# Patient Record
Sex: Male | Born: 1965 | Race: White | Hispanic: No | Marital: Married | Smoking: Former smoker
Health system: Southern US, Community
[De-identification: ages and names within clinical notes are randomized; demographics above are authoritative.]

## PROBLEM LIST (undated history)

## (undated) DIAGNOSIS — K219 Gastro-esophageal reflux disease without esophagitis: Secondary | ICD-10-CM

## (undated) DIAGNOSIS — M1990 Unspecified osteoarthritis, unspecified site: Secondary | ICD-10-CM

## (undated) DIAGNOSIS — F419 Anxiety disorder, unspecified: Secondary | ICD-10-CM

## (undated) DIAGNOSIS — I7300 Raynaud's syndrome without gangrene: Secondary | ICD-10-CM

## (undated) DIAGNOSIS — T7840XA Allergy, unspecified, initial encounter: Secondary | ICD-10-CM

## (undated) HISTORY — PX: COLONOSCOPY: SHX174

## (undated) HISTORY — DX: Raynaud's syndrome without gangrene: I73.00

## (undated) HISTORY — PX: INGUINAL HERNIA REPAIR: SUR1180

## (undated) HISTORY — DX: Gastro-esophageal reflux disease without esophagitis: K21.9

## (undated) HISTORY — DX: Allergy, unspecified, initial encounter: T78.40XA

## (undated) HISTORY — DX: Unspecified osteoarthritis, unspecified site: M19.90

---

## 2013-03-11 ENCOUNTER — Emergency Department (HOSPITAL_COMMUNITY)
Admission: EM | Admit: 2013-03-11 | Discharge: 2013-03-11 | Disposition: A | Discharge status: Home / Self Care | Payer: PRIVATE HEALTH INSURANCE | Attending: Emergency Medicine | Admitting: Emergency Medicine

## 2013-03-11 ENCOUNTER — Encounter (HOSPITAL_COMMUNITY): Payer: Self-pay | Admitting: Emergency Medicine

## 2013-03-11 ENCOUNTER — Emergency Department (HOSPITAL_COMMUNITY): Payer: PRIVATE HEALTH INSURANCE

## 2013-03-11 ENCOUNTER — Encounter: Payer: Self-pay | Admitting: *Deleted

## 2013-03-11 DIAGNOSIS — Z8659 Personal history of other mental and behavioral disorders: Secondary | ICD-10-CM | POA: Insufficient documentation

## 2013-03-11 DIAGNOSIS — Z79899 Other long term (current) drug therapy: Secondary | ICD-10-CM | POA: Insufficient documentation

## 2013-03-11 DIAGNOSIS — R42 Dizziness and giddiness: Secondary | ICD-10-CM | POA: Insufficient documentation

## 2013-03-11 DIAGNOSIS — Z791 Long term (current) use of non-steroidal anti-inflammatories (NSAID): Secondary | ICD-10-CM | POA: Insufficient documentation

## 2013-03-11 DIAGNOSIS — R0789 Other chest pain: Secondary | ICD-10-CM | POA: Insufficient documentation

## 2013-03-11 DIAGNOSIS — R61 Generalized hyperhidrosis: Secondary | ICD-10-CM | POA: Insufficient documentation

## 2013-03-11 HISTORY — DX: Anxiety disorder, unspecified: F41.9

## 2013-03-11 LAB — COMPREHENSIVE METABOLIC PANEL
Albumin: 4.3 g/dL (ref 3.5–5.2)
BUN: 15 mg/dL (ref 6–23)
Chloride: 103 mEq/L (ref 96–112)
Creatinine, Ser: 0.92 mg/dL (ref 0.50–1.35)
Total Bilirubin: 0.3 mg/dL (ref 0.3–1.2)
Total Protein: 7.3 g/dL (ref 6.0–8.3)

## 2013-03-11 LAB — CBC
HCT: 44.8 % (ref 39.0–52.0)
MCH: 30.5 pg (ref 26.0–34.0)
MCHC: 35 g/dL (ref 30.0–36.0)
MCV: 87.2 fL (ref 78.0–100.0)
RDW: 13.2 % (ref 11.5–15.5)

## 2013-03-11 LAB — POCT I-STAT, CHEM 8
Creatinine, Ser: 1.1 mg/dL (ref 0.50–1.35)
Hemoglobin: 15.6 g/dL (ref 13.0–17.0)
Potassium: 3.8 mEq/L (ref 3.5–5.1)
Sodium: 142 mEq/L (ref 135–145)

## 2013-03-11 LAB — POCT I-STAT TROPONIN I: Troponin i, poc: 0.04 ng/mL (ref 0.00–0.08)

## 2013-03-11 NOTE — ED Provider Notes (Signed)
History     CSN: [id number]  Arrival date & time 03/11/13  1331   First MD Initiated Contact with Patient 03/11/13 1507      No chief complaint on file.   (Consider location/radiation/quality/duration/timing/severity/associated sxs/prior treatment) HPI Comments: Patient is a 47 year old male with no significant past medical history who presents for 3 episodes of chest pain beginning at the end of last week. Patient states his chest pain is substernal and starts as a tightening/squeezing sensation becoming sharp in quality over time. Patient's first episode at the end of last week lasted approximately one to 2 minutes. He then admits to a second episode yesterday morning before church lasting approximately 15 minutes; his third episode occurred at 8:00 this morning and lasted 30 minutes before completely resolving while the patient was taking out the trash. Patient denies any radiation of his chest pain as well as any aggravating or alleviating factors; states he took aspirin yesterday without only mild relief. He admits to associated symptoms of diaphoresis, lightheadedness/dizziness, stating "it feels like my head is underwater", as well as a heavy sensation in his bilateral upper extremities.patient denies headache, fevers, vision changes, shortness of breath, nausea or vomiting, abdominal pain, and numbness or tingling in his upper and lower extremities. Patient admits to a family history of coronary artery disease in his father; states his father "had a few stents placed in his mid 10s". Patient admits to smoking history but states he quit 10 years ago. The history is provided by the patient. No language interpreter was used.    Past Medical History  Diagnosis Date  . Anxiety     No past surgical history on file.  No family history on file.  History  Substance Use Topics  . Smoking status: Never Smoker   . Smokeless tobacco: Not on file  . Alcohol Use: Yes     Review of Systems   Constitutional: Positive for diaphoresis. Negative for fever and chills.  HENT: Negative for hearing loss and tinnitus.   Eyes: Negative for visual disturbance.  Respiratory: Negative for shortness of breath.   Cardiovascular: Positive for chest pain. Negative for palpitations and leg swelling.  Gastrointestinal: Negative for nausea, vomiting and abdominal pain.  Skin: Negative for color change.  Neurological: Positive for dizziness and light-headedness. Negative for syncope, speech difficulty and headaches.  All other systems reviewed and are negative.    Allergies  Lodine  Home Medications   Current Outpatient Rx  Name  Route  Sig  Dispense  Refill  . busPIRone (BUSPAR) 15 MG tablet   Oral   Take 15 mg by mouth 2 (two) times daily.         Marland Kitchen FIBER PO   Oral   Take 1 capsule by mouth 2 (two) times daily.         . naproxen (NAPROSYN) 500 MG tablet   Oral   Take 500 mg by mouth 2 (two) times daily with a meal.           BP 122/88  Pulse 70  Temp(Src) 98.1 F (36.7 C)  Resp 18  SpO2 99%  Physical Exam  Nursing note and vitals reviewed. Constitutional: He is oriented to person, place, and time. He appears well-developed and well-nourished. No distress.  Patient is calm, well and nontoxic appearing, and in no acute distress. He denies experiencing any chest pain at this time.  HENT:  Head: Normocephalic and atraumatic.  Mouth/Throat: Oropharynx is clear and moist. No oropharyngeal exudate.  Eyes: Conjunctivae and EOM are normal. Pupils are equal, round, and reactive to light. No scleral icterus.  Neck: Normal range of motion. Neck supple.  Cardiovascular: Normal rate, regular rhythm, normal heart sounds and intact distal pulses.   Distal radial, dorsalis pedis, and posterior tibial pulses 2+ bilaterally. Capillary refill less than 2 seconds in bilateral upper extremities  Pulmonary/Chest: Effort normal and breath sounds normal. No respiratory distress. He has  no wheezes. He has no rales. He exhibits no tenderness.  Abdominal: Soft. Bowel sounds are normal. He exhibits no distension. There is no tenderness. There is no rebound and no guarding.  Musculoskeletal: Normal range of motion. He exhibits no edema.  Lymphadenopathy:    He has no cervical adenopathy.  Neurological: He is alert and oriented to person, place, and time. No cranial nerve deficit.  Skin: Skin is warm and dry. He is not diaphoretic.  Psychiatric: He has a normal mood and affect. His behavior is normal.    ED Course  Procedures (including critical care time)  Labs Reviewed  POCT I-STAT, CHEM 8 - Abnormal; Notable for the following:    Calcium, Ion 1.28 (*)    All other components within normal limits  CBC  COMPREHENSIVE METABOLIC PANEL  POCT I-STAT TROPONIN I  POCT I-STAT TROPONIN I   Dg Chest 2 View  03/11/2013  *RADIOLOGY REPORT*  Clinical Data: Chest pain  CHEST - 2 VIEW  Comparison: None.  Findings: Several round and oval opacities project over the lung zones compatible with buttons.  Allowing for this, there are no definite pulmonary nodules.  No pneumothorax.  No pleural effusion. Normal heart size.  IMPRESSION: No active cardiopulmonary disease.   Original Report Authenticated By: Jolaine Click, M.D.     Date: 03/11/2013  Rate: 80  Rhythm: normal sinus rhythm  QRS Axis: normal  Intervals: normal  ST/T Wave abnormalities: normal  Conduction Disutrbances:right bundle branch block  Narrative Interpretation: NSR with incomplete RBBB; no STEMI  Old EKG Reviewed: none available I have personally reviewed and interpreted this EKG   1. Chest pain, midsternal      MDM  Patient presents for 3 episodes of chest pain increasing in duration. Patient admits to associated symptoms of diaphoresis, lightheadedness/dizziness, and a heavy sensation in his bilateral arms. Patient's EKG today significant for incomplete right bundle with no evidence of STEMI. His chest x-ray was  negative for signs of acute cardiopulmonary disease and troponin not elevated at 0.04. Second troponin remained 0.04 after 3 hours. He has been symptom free since 8:30AM this morning. At this time patient is calm, well and nontoxic appearing, and in no acute distress with stable vital signs. Patient has a TIMI score of 2, putting him at very low risk of ACS. Believe patient is stable for d/c with cardiology follow up in 24-48 hours for further work up and evaluation of the patient's chest pain as well as PCP follow up. Patient states comfort and understanding with this d/c plan without any unaddressed concerns. This ED work up and management plan was discussed with Dr. Rubin Payor prior to patient d/c; Dr. Rubin Payor in agreement.     Antony Madura, PA-C 03/12/13 1158

## 2013-03-11 NOTE — Progress Notes (Signed)
Patient ID: Danny Snyder, male   DOB: [birth year], 47 y.o.   MRN: [id number] PATIENT CALLED WITH CP, SOB, AND ARM WEAKNESS BILATERALLY. HAS HAD 3 EPISODES SINCE LAST WEEK, CURRENTLY HAVING SYMPTOMS NOW. WILL GO TO ER BY CAR. REFUSED EMS. MOTHER WITH TAKE HIM IMMEDIATELY TO ER. SPOKE WITH EMPLOYEE JIMMY THAT WAS WITH [name] AND HE UNDERSTOOD TO GET HIM TO ER NOW.

## 2013-03-11 NOTE — ED Notes (Signed)
Chest pain started last week came and went yesterday and today had  2 episode and they both passed  Feels funny and swimmy hweaded he states this am pain last 30 mins  sqeezng then goes to sharp

## 2013-03-11 NOTE — ED Notes (Signed)
Discharge instructions reviewed. Pt verbalized understanding.  

## 2013-03-11 NOTE — ED Notes (Signed)
Pt states he began having some intermittent cp that started last Sunday. Pt states he had 3 episodes.  The pain has progressively gotten worse and the last pain lasted for about . Pt denies any cardiac hx. Pt states he is in no pain at this present moment. Pt describes pain as a progressive tightening in the chest and a sharp pain with no radiation, sob, n/v or diaphoresis. Pt does state that he gets lightheaded and dizzy and weakness to upper extremities.

## 2013-03-12 NOTE — ED Provider Notes (Signed)
Medical screening examination/treatment/procedure(s) were conducted as a shared visit with non-physician practitioner(s) and myself.  I personally evaluated the patient during the encounter. Chest pain. Negative troponin x2. EKG reassuring. Will be discharged home follow up with cardiology for  [facility] R. Rubin Payor, MD 03/12/13 [phone]

## 2013-03-26 ENCOUNTER — Other Ambulatory Visit: Payer: Self-pay | Admitting: *Deleted

## 2013-03-26 MED ORDER — NAPROXEN 500 MG PO TABS: 500.0000 mg | ORAL_TABLET | Freq: Two times a day (BID) | ORAL | Status: DC

## 2013-03-26 MED ORDER — BUSPIRONE HCL 15 MG PO TABS: 15.0000 mg | ORAL_TABLET | Freq: Three times a day (TID) | ORAL | Status: DC | PRN

## 2013-03-26 NOTE — Telephone Encounter (Signed)
Patient last seen on 09-13-12 for sick visit. Last chronic med visit was 03-01-12. Please advise. Thank you

## 2013-04-16 ENCOUNTER — Encounter: Payer: PRIVATE HEALTH INSURANCE | Admitting: Cardiology

## 2013-05-18 ENCOUNTER — Other Ambulatory Visit: Payer: Self-pay | Admitting: Nurse Practitioner

## 2013-05-20 NOTE — Telephone Encounter (Signed)
No labs or seen since 03/13

## 2013-06-24 ENCOUNTER — Other Ambulatory Visit: Payer: Self-pay | Admitting: Nurse Practitioner

## 2013-06-26 NOTE — Telephone Encounter (Signed)
LAST OV 9/13. LAST RF 05/20/13. SEE ALLERGIES PLEASE.

## 2013-07-10 ENCOUNTER — Other Ambulatory Visit: Payer: Self-pay | Admitting: Nurse Practitioner

## 2013-07-12 NOTE — Telephone Encounter (Signed)
Last seen 09/13/12( MMM)

## 2013-07-15 NOTE — Telephone Encounter (Signed)
buspar rx sent in need to be seen

## 2013-07-15 NOTE — Telephone Encounter (Signed)
Please advise 

## 2013-07-18 NOTE — Telephone Encounter (Signed)
Left message to please call for an appointment.

## 2013-08-01 ENCOUNTER — Other Ambulatory Visit: Payer: Self-pay | Admitting: Nurse Practitioner

## 2013-08-05 NOTE — Telephone Encounter (Signed)
Last seen 09/13/12  MMM 

## 2013-08-05 NOTE — Telephone Encounter (Signed)
Patient NTBS for naprosyn refill

## 2013-08-06 NOTE — Telephone Encounter (Signed)
Left details on pt vm.

## 2013-09-10 ENCOUNTER — Other Ambulatory Visit: Payer: Self-pay | Admitting: Nurse Practitioner

## 2013-09-12 NOTE — Telephone Encounter (Signed)
Please cll in buspar rx- ntbs

## 2013-09-12 NOTE — Telephone Encounter (Signed)
Last seen 09/13/12  MMM

## 2013-11-05 ENCOUNTER — Other Ambulatory Visit: Payer: Self-pay | Admitting: Nurse Practitioner

## 2013-11-15 ENCOUNTER — Encounter: Payer: Self-pay | Admitting: Family Medicine

## 2013-11-15 ENCOUNTER — Ambulatory Visit (INDEPENDENT_AMBULATORY_CARE_PROVIDER_SITE_OTHER): Payer: PRIVATE HEALTH INSURANCE | Admitting: Family Medicine

## 2013-11-15 VITALS — BP 138/92 | HR 86 | Temp 97.6°F | Ht 77.0 in | Wt 238.0 lb

## 2013-11-15 DIAGNOSIS — M25529 Pain in unspecified elbow: Secondary | ICD-10-CM

## 2013-11-15 DIAGNOSIS — M25521 Pain in right elbow: Secondary | ICD-10-CM

## 2013-11-15 MED ORDER — NAPROXEN 500 MG PO TABS: 500.0000 mg | ORAL_TABLET | Freq: Two times a day (BID) | ORAL | Status: DC

## 2013-11-15 NOTE — Progress Notes (Signed)
   Subjective:    Patient ID: Danny Snyder, male    DOB: [birth year], 47 y.o.   MRN: [id number]  HPI  This 47 y.o. male presents for evaluation of right elbow discomfort.  He has injured it a few months ago and it Is not better.  He has been having persistent right elbow arthralgias.  Review of Systems C/o right elbow arthralgias   No chest pain, SOB, HA, dizziness, vision change, N/V, diarrhea, constipation, dysuria, urinary urgency or frequency or rash.  Objective:   Physical Exam Vital signs noted  Well developed well nourished male.  HEENT - Head atraumatic Normocephalic                Eyes - PERRLA, Conjuctiva - clear Sclera- Clear EOMI Respiratory - Lungs CTA bilateral Cardiac - RRR S1 and S2 without murmur MS - TTP right elbow without swelling or deformity.  No crepitus in right Elbow.  No tenderness along the medial or lateral epicondyle.       Assessment & Plan:  Elbow pain, right - Plan: naproxen (NAPROSYN) 500 MG tablet Bid x 2 weeks.  Discussed if he has continued pain and discomfort then would refer to Orthopedics.  Deatra Canter FNP

## 2013-12-02 ENCOUNTER — Encounter: Payer: Self-pay | Admitting: Family Medicine

## 2013-12-02 ENCOUNTER — Ambulatory Visit (INDEPENDENT_AMBULATORY_CARE_PROVIDER_SITE_OTHER): Payer: PRIVATE HEALTH INSURANCE | Admitting: Family Medicine

## 2013-12-02 VITALS — BP 163/111 | HR 73 | Temp 98.1°F | Wt 239.0 lb

## 2013-12-02 DIAGNOSIS — J209 Acute bronchitis, unspecified: Secondary | ICD-10-CM

## 2013-12-02 MED ORDER — BENZONATATE 100 MG PO CAPS: 200.0000 mg | ORAL_CAPSULE | Freq: Three times a day (TID) | ORAL | Status: DC | PRN

## 2013-12-02 MED ORDER — AMOXICILLIN 875 MG PO TABS: 875.0000 mg | ORAL_TABLET | Freq: Two times a day (BID) | ORAL | Status: DC

## 2013-12-02 NOTE — Progress Notes (Signed)
   Subjective:    Patient ID: Danny Snyder, male    DOB: [birth year], 47 y.o.   MRN: [id number]  HPI This 47 y.o. male presents for evaluation of cough and URI sx's.  He has Been haivng persistent cough.  Review of Systems    No chest pain, SOB, HA, dizziness, vision change, N/V, diarrhea, constipation, dysuria, urinary urgency or frequency, myalgias, arthralgias or rash.  Objective:   Physical Exam  Vital signs noted  Well developed well nourished male.  HEENT - Head atraumatic Normocephalic                Eyes - PERRLA, Conjuctiva - clear Sclera- Clear EOMI                Ears - EAC's Wnl TM's Wnl Gross Hearing WNL                Throat - oropharanx wnl Respiratory - Lungs CTA bilateral Cardiac - RRR S1 and S2 without murmur GI - Abdomen soft Nontender and bowel sounds active x 4 Extremities - No edema. Neuro - Grossly intact.      Assessment & Plan:  Acute bronchitis - Plan: amoxicillin (AMOXIL) 875 MG tablet, benzonatate (TESSALON PERLES) 100 MG capsule  Push po fluids, rest, tylenol and motrin otc prn as directed for fever, arthralgias, and myalgias.  Follow up prn if sx's continue or persist.  Deatra Canter FNP

## 2013-12-08 ENCOUNTER — Other Ambulatory Visit: Payer: Self-pay | Admitting: Nurse Practitioner

## 2013-12-15 ENCOUNTER — Other Ambulatory Visit: Payer: Self-pay | Admitting: Family Medicine

## 2014-01-09 ENCOUNTER — Other Ambulatory Visit: Payer: Self-pay | Admitting: Nurse Practitioner

## 2014-01-13 NOTE — Telephone Encounter (Signed)
Only an acute visit in computer. Please review and advise

## 2014-01-24 ENCOUNTER — Other Ambulatory Visit: Payer: Self-pay | Admitting: Nurse Practitioner

## 2014-02-20 ENCOUNTER — Other Ambulatory Visit: Payer: Self-pay | Admitting: Family Medicine

## 2014-03-23 ENCOUNTER — Other Ambulatory Visit: Payer: Self-pay | Admitting: Family Medicine

## 2014-03-25 ENCOUNTER — Other Ambulatory Visit: Payer: Self-pay | Admitting: Family Medicine

## 2014-06-17 ENCOUNTER — Ambulatory Visit (INDEPENDENT_AMBULATORY_CARE_PROVIDER_SITE_OTHER): Payer: PRIVATE HEALTH INSURANCE | Admitting: Physician Assistant

## 2014-06-17 ENCOUNTER — Encounter: Payer: Self-pay | Admitting: Physician Assistant

## 2014-06-17 VITALS — BP 146/94 | HR 73 | Temp 98.5°F | Ht 77.0 in | Wt 237.0 lb

## 2014-06-17 DIAGNOSIS — M722 Plantar fascial fibromatosis: Secondary | ICD-10-CM

## 2014-06-17 MED ORDER — MELOXICAM 15 MG PO TABS: 15.0000 mg | ORAL_TABLET | Freq: Every day | ORAL | Status: DC

## 2014-06-17 NOTE — Progress Notes (Signed)
Subjective:     Patient ID: Danny Snyder, male   DOB: [birth year], 48 y.o.   MRN: [id number]  HPI Pt here with foot/arch pain No trauma Sx have been going on for several months Denies trauma but stands on his feet all day He makes sure he has good shoes and switches on a regular basis  Review of Systems Pain to the post medial arch area Sx worse after sitting or first thing in the am No hx of same OTC meds help some    Objective:   Physical Exam Pt wearing flops No ecchy/edema to the foot FROM of the foot Pt with very high arch Good pulses/sensory +TTP to along the medial distal plantar fasc      Assessment:     Plantar fasc    Plan:     Hold OTC NSAIDS Heat/Ice Good shoes Better arch support Stretching Mobic 15mg  qd #10 Nl course reviewed F/U prn

## 2014-06-17 NOTE — Patient Instructions (Signed)
Plantar Fasciitis  Plantar fasciitis is a common condition that causes foot pain. It is soreness (inflammation) of the band of tough fibrous tissue on the bottom of the foot that runs from the heel bone (calcaneus) to the ball of the foot. The cause of this soreness may be from excessive standing, poor fitting shoes, running on hard surfaces, being overweight, having an abnormal walk, or overuse (this is common in runners) of the painful foot or feet. It is also common in aerobic exercise dancers and ballet dancers.  SYMPTOMS   Most people with plantar fasciitis complain of:   Severe pain in the morning on the bottom of their foot especially when taking the first steps out of bed. This pain recedes after a few minutes of walking.   Severe pain is experienced also during walking following a long period of inactivity.   Pain is worse when walking barefoot or up stairs  DIAGNOSIS    Your caregiver will diagnose this condition by examining and feeling your foot.   Special tests such as X-rays of your foot, are usually not needed.  PREVENTION    Consult a sports medicine professional before beginning a new exercise program.   Walking programs offer a good workout. With walking there is a lower chance of overuse injuries common to runners. There is less impact and less jarring of the joints.   Begin all new exercise programs slowly. If problems or pain develop, decrease the amount of time or distance until you are at a comfortable level.   Wear good shoes and replace them regularly.   Stretch your foot and the heel cords at the back of the ankle (Achilles tendon) both before and after exercise.   Run or exercise on even surfaces that are not hard. For example, asphalt is better than pavement.   Do not run barefoot on hard surfaces.   If using a treadmill, vary the incline.   Do not continue to workout if you have foot or joint problems. Seek professional help if they do not improve.  HOME CARE INSTRUCTIONS     Avoid activities that cause you pain until you recover.   Use ice or cold packs on the problem or painful areas after working out.   Only take over-the-counter or prescription medicines for pain, discomfort, or fever as directed by your caregiver.   Soft shoe inserts or athletic shoes with air or gel sole cushions may be helpful.   If problems continue or become more severe, consult a sports medicine caregiver or your own health care provider. Cortisone is a potent anti-inflammatory medication that may be injected into the painful area. You can discuss this treatment with your caregiver.  MAKE SURE YOU:    Understand these instructions.   Will watch your condition.   Will get help right away if you are not doing well or get worse.  Document Released: 08/30/2001 Document Revised: 02/27/2012 Document Reviewed: 10/29/2008  [employer] Patient Information 2015 [employer]. This information is not intended to replace advice given to you by your health care provider. Make sure you discuss any questions you have with your health care provider.

## 2014-07-15 ENCOUNTER — Other Ambulatory Visit: Payer: Self-pay | Admitting: Physician Assistant

## 2014-07-16 NOTE — Telephone Encounter (Signed)
Last ov 06/17/14. Pt t has Naprosyn and Mobic on med list. See allergies.

## 2014-08-14 ENCOUNTER — Other Ambulatory Visit: Payer: Self-pay | Admitting: Family Medicine

## 2014-08-18 NOTE — Telephone Encounter (Signed)
Last ov 6/15 with [facility] and then 12/14 with [facility]. Not sure if this med was intended long term for pt. See allergies.

## 2014-09-18 ENCOUNTER — Other Ambulatory Visit: Payer: Self-pay | Admitting: Family Medicine

## 2014-10-23 ENCOUNTER — Other Ambulatory Visit: Payer: Self-pay | Admitting: Family Medicine

## 2015-01-12 ENCOUNTER — Ambulatory Visit (INDEPENDENT_AMBULATORY_CARE_PROVIDER_SITE_OTHER): Payer: BLUE CROSS/BLUE SHIELD

## 2015-01-12 ENCOUNTER — Ambulatory Visit (INDEPENDENT_AMBULATORY_CARE_PROVIDER_SITE_OTHER): Payer: BLUE CROSS/BLUE SHIELD | Admitting: Family Medicine

## 2015-01-12 ENCOUNTER — Other Ambulatory Visit: Payer: Self-pay | Admitting: Family Medicine

## 2015-01-12 ENCOUNTER — Encounter: Payer: Self-pay | Admitting: Family Medicine

## 2015-01-12 VITALS — BP 139/96 | HR 86 | Temp 97.6°F | Ht 77.0 in | Wt 248.0 lb

## 2015-01-12 DIAGNOSIS — M79644 Pain in right finger(s): Secondary | ICD-10-CM | POA: Diagnosis not present

## 2015-01-12 DIAGNOSIS — R52 Pain, unspecified: Secondary | ICD-10-CM

## 2015-01-12 DIAGNOSIS — M25531 Pain in right wrist: Secondary | ICD-10-CM

## 2015-01-12 DIAGNOSIS — I7300 Raynaud's syndrome without gangrene: Secondary | ICD-10-CM

## 2015-01-12 MED ORDER — MELOXICAM 15 MG PO TABS: 15.0000 mg | ORAL_TABLET | Freq: Every day | ORAL | Status: DC

## 2015-01-12 MED ORDER — NIFEDIPINE ER OSMOTIC RELEASE 30 MG PO TB24: 30.0000 mg | ORAL_TABLET | Freq: Every day | ORAL | Status: DC

## 2015-01-12 NOTE — Progress Notes (Signed)
Subjective:    Patient ID: Danny Snyder, male    DOB: Oct 26, 1966, 49 y.o.   MRN: [id number]  HPI  Patient comes in today complaining of pain in the right hand. Specifically the pain is located at the base of the right thumb. It is moderate to severe. It is episodic. It is becoming more constant with time. Onset about a year ago. He was told it was overuse but with rest it has not improved. He is taking ibuprofen several times a day without relief. He states that it is becoming weaker with time. His grip strength is lessened. 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 This involves fairly constant torque to the wrist. Additionally he says that when it's cold outside the fingers will go numb and turn red and then blue and then finally white. Relief with warming them up  Review of Systems  Constitutional: Negative for fever, chills, diaphoresis and unexpected weight change.  HENT: Negative for congestion, hearing loss, rhinorrhea, sore throat and trouble swallowing.   Gastrointestinal: Negative for nausea, vomiting, abdominal pain, diarrhea, constipation and abdominal distention.  Endocrine: Negative for cold intolerance and heat intolerance.  Genitourinary: Negative for dysuria, hematuria and flank pain.  Musculoskeletal: Negative for joint swelling and arthralgias.  Skin: Negative for rash.  Neurological: Negative for dizziness and headaches.  Psychiatric/Behavioral: Negative for dysphoric mood, decreased concentration and agitation. The patient is not nervous/anxious.        Objective:   Physical Exam  Constitutional: He is oriented to person, place, and time. He appears well-developed and well-nourished. No distress.  HENT:  Head: Normocephalic and atraumatic.  Right Ear: External ear normal.  Left Ear: External ear normal.  Nose: Nose normal.  Mouth/Throat: Oropharynx is clear and moist.  Eyes: Conjunctivae and EOM are normal. Pupils are equal, round, and reactive to light.    Neck: Normal range of motion. Neck supple. No thyromegaly present.  Cardiovascular: Normal rate, regular rhythm and normal heart sounds.   No murmur heard. Pulmonary/Chest: Effort normal and breath sounds normal. No respiratory distress. He has no wheezes. He has no rales.  Abdominal: Soft. Bowel sounds are normal. He exhibits no distension. There is no tenderness.  Lymphadenopathy:    He has no cervical adenopathy.  Neurological: He is alert and oriented to person, place, and time. He has normal reflexes.  Skin: Skin is warm and dry.  Psychiatric: He has a normal mood and affect. His behavior is normal. Judgment and thought content normal.   BP 139/96 mmHg  Pulse 86  Temp(Src) 97.6 F (36.4 C) (Oral)  Ht 6\' 5"  (1.956 m)  Wt 248 lb (112.492 kg)  BMI 29.40 kg/m2       Assessment & Plan:   1. Wrist arthralgia, right   2. Thumb pain, right   3. Raynaud's phenomenon     Meds ordered this encounter  Medications  . meloxicam (MOBIC) 15 MG tablet    Sig: Take 1 tablet (15 mg total) by mouth daily. With food    Dispense:  30 tablet    Refill:  2  . NIFEdipine (PROCARDIA XL) 30 MG 24 hr tablet    Sig: Take 1 tablet (30 mg total) by mouth daily. To stimulate circulation to the hands    Dispense:  30 tablet    Refill:  5    No orders of the defined types were placed in this encounter.    Labs pending Health Maintenance Diet and exercise  encouraged Continue all meds as discussed Follow up in 1 month  Claretta Fraise, MD

## 2015-01-12 NOTE — Patient Instructions (Signed)
Wear brace at all times Discontinue advil/ibuprofen Okay to take tylenol as needed If not better in 2 weeks return for joint injection.

## 2015-02-12 ENCOUNTER — Ambulatory Visit (INDEPENDENT_AMBULATORY_CARE_PROVIDER_SITE_OTHER): Payer: BLUE CROSS/BLUE SHIELD | Admitting: Family Medicine

## 2015-02-12 ENCOUNTER — Encounter: Payer: Self-pay | Admitting: Family Medicine

## 2015-02-12 VITALS — BP 159/91 | HR 99 | Temp 97.4°F | Ht 77.0 in | Wt 243.0 lb

## 2015-02-12 DIAGNOSIS — J40 Bronchitis, not specified as acute or chronic: Secondary | ICD-10-CM

## 2015-02-12 DIAGNOSIS — M79644 Pain in right finger(s): Secondary | ICD-10-CM

## 2015-02-12 DIAGNOSIS — J329 Chronic sinusitis, unspecified: Secondary | ICD-10-CM | POA: Insufficient documentation

## 2015-02-12 DIAGNOSIS — I7300 Raynaud's syndrome without gangrene: Secondary | ICD-10-CM | POA: Diagnosis not present

## 2015-02-12 DIAGNOSIS — M79646 Pain in unspecified finger(s): Secondary | ICD-10-CM | POA: Insufficient documentation

## 2015-02-12 MED ORDER — AMOXICILLIN-POT CLAVULANATE 875-125 MG PO TABS: 1.0000 | ORAL_TABLET | Freq: Two times a day (BID) | ORAL | Status: DC

## 2015-02-12 MED ORDER — PSEUDOEPHEDRINE-GUAIFENESIN ER 120-1200 MG PO TB12: 1.0000 | ORAL_TABLET | Freq: Two times a day (BID) | ORAL | Status: DC
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
## 2015-02-27 NOTE — Addendum Note (Signed)
Addended by: Claretta Fraise on: 02/27/2015 12:35 PM   Modules accepted: Miquel Dunn

## 2015-05-04 ENCOUNTER — Other Ambulatory Visit: Payer: Self-pay | Admitting: Family Medicine

## 2015-06-01 ENCOUNTER — Other Ambulatory Visit: Payer: Self-pay | Admitting: Family Medicine

## 2015-06-15 ENCOUNTER — Emergency Department (HOSPITAL_COMMUNITY)
Admission: EM | Admit: 2015-06-15 | Discharge: 2015-06-16 | Disposition: A | Discharge status: Home / Self Care | Payer: BLUE CROSS/BLUE SHIELD | Attending: Emergency Medicine | Admitting: Emergency Medicine

## 2015-06-15 ENCOUNTER — Encounter (HOSPITAL_COMMUNITY): Payer: Self-pay | Admitting: Emergency Medicine

## 2015-06-15 DIAGNOSIS — Z791 Long term (current) use of non-steroidal anti-inflammatories (NSAID): Secondary | ICD-10-CM | POA: Diagnosis not present

## 2015-06-15 DIAGNOSIS — Y998 Other external cause status: Secondary | ICD-10-CM | POA: Insufficient documentation

## 2015-06-15 DIAGNOSIS — Z8659 Personal history of other mental and behavioral disorders: Secondary | ICD-10-CM | POA: Insufficient documentation

## 2015-06-15 DIAGNOSIS — S060X0A Concussion without loss of consciousness, initial encounter: Secondary | ICD-10-CM | POA: Diagnosis not present

## 2015-06-15 DIAGNOSIS — Y9389 Activity, other specified: Secondary | ICD-10-CM | POA: Insufficient documentation

## 2015-06-15 DIAGNOSIS — W208XXA Other cause of strike by thrown, projected or falling object, initial encounter: Secondary | ICD-10-CM | POA: Diagnosis not present

## 2015-06-15 DIAGNOSIS — Y9289 Other specified places as the place of occurrence of the external cause: Secondary | ICD-10-CM | POA: Diagnosis not present

## 2015-06-15 DIAGNOSIS — Z79899 Other long term (current) drug therapy: Secondary | ICD-10-CM | POA: Insufficient documentation

## 2015-06-15 DIAGNOSIS — S0990XA Unspecified injury of head, initial encounter: Secondary | ICD-10-CM | POA: Diagnosis present

## 2015-06-15 LAB — CBC WITH DIFFERENTIAL/PLATELET
BASOS ABS: 0 10*3/uL (ref 0.0–0.1)
BASOS PCT: 0 % (ref 0–1)
EOS ABS: 0.1 10*3/uL (ref 0.0–0.7)
Eosinophils Relative: 2 % (ref 0–5)
HCT: 45.2 % (ref 39.0–52.0)
HEMOGLOBIN: 15.4 g/dL (ref 13.0–17.0)
Lymphocytes Relative: 27 % (ref 12–46)
Lymphs Abs: 1.9 10*3/uL (ref 0.7–4.0)
MCH: 30.7 pg (ref 26.0–34.0)
MCHC: 34.1 g/dL (ref 30.0–36.0)
MCV: 90.2 fL (ref 78.0–100.0)
MONOS PCT: 6 % (ref 3–12)
Monocytes Absolute: 0.4 10*3/uL (ref 0.1–1.0)
NEUTROS PCT: 65 % (ref 43–77)
Neutro Abs: 4.6 10*3/uL (ref 1.7–7.7)
PLATELETS: 165 10*3/uL (ref 150–400)
RBC: 5.01 MIL/uL (ref 4.22–5.81)
RDW: 12.7 % (ref 11.5–15.5)
WBC: 7 10*3/uL (ref 4.0–10.5)

## 2015-06-15 LAB — COMPREHENSIVE METABOLIC PANEL
ALBUMIN: 4 g/dL (ref 3.5–5.0)
ALT: 63 U/L (ref 17–63)
AST: 61 U/L — AB (ref 15–41)
Alkaline Phosphatase: 74 U/L (ref 38–126)
Anion gap: 9 (ref 5–15)
BUN: 14 mg/dL (ref 6–20)
CALCIUM: 9.9 mg/dL (ref 8.9–10.3)
CO2: 27 mmol/L (ref 22–32)
CREATININE: 1.12 mg/dL (ref 0.61–1.24)
Chloride: 102 mmol/L (ref 101–111)
GFR calc Af Amer: >60 mL/min (ref >60)
GFR calc non Af Amer: >60 mL/min (ref >60)
Glucose, Bld: 106 mg/dL — ABNORMAL HIGH (ref 65–99)
Potassium: 3.8 mmol/L (ref 3.5–5.1)
Sodium: 138 mmol/L (ref 135–145)
TOTAL PROTEIN: 6.5 g/dL (ref 6.5–8.1)
Total Bilirubin: 0.7 mg/dL (ref 0.3–1.2)

## 2015-06-15 MED ORDER — TIZANIDINE HCL 4 MG PO TABS
4.0000 mg | ORAL_TABLET | Freq: Once | ORAL | Status: AC
  Administered 2015-06-15: 4 mg via ORAL
  Filled 2015-06-15: qty 1

## 2015-06-15 NOTE — ED Provider Notes (Signed)
CSN: [id number]     Arrival date & time 06/15/15  1944 History   First MD Initiated Contact with Patient 06/15/15 2205     Chief Complaint  Patient presents with  . Head Injury     (Consider location/radiation/quality/duration/timing/severity/associated sxs/prior Treatment) HPI Comments: Patient is a 49 yo M PMHx significant for anxiety presenting to the ED for evaluation of dull throbbing headaches with "swimming head" and confusion since having a garage door fall on the top of his head last Thursday. Patient states his vision blacked out, but he did not have any LOC or vomiting. He has had continued headaches and nausea and confusion since then. Denies any emesis, numbness or weakness in his extremities.   Patient is a 49 y.o. male presenting with head injury.  Head Injury Associated symptoms: headache   Associated symptoms: no numbness     Past Medical History  Diagnosis Date  . Anxiety    History reviewed. No pertinent past surgical history. No family history on file. History  Substance Use Topics  . Smoking status: Never Smoker   . Smokeless tobacco: Not on file  . Alcohol Use: Yes    Review of Systems  Eyes: Negative for visual disturbance.  Neurological: Positive for headaches. Negative for dizziness, syncope, weakness, light-headedness and numbness.  All other systems reviewed and are negative.     Allergies  Lodine  Home Medications   Prior to Admission medications   Medication Sig Start Date End Date Taking? Authorizing Provider  FIBER PO Take 3 capsules by mouth 2 (two) times daily.    Yes Historical Provider, MD  Glucosamine-Chondroitin (OSTEO BI-FLEX REGULAR STRENGTH PO) Take by mouth.   Yes Historical Provider, MD  lansoprazole (PREVACID) 30 MG capsule Take 30 mg by mouth daily at 12 noon.   Yes Historical Provider, MD  meloxicam (MOBIC) 15 MG tablet Take 15 mg by mouth daily. 06/01/15  Yes Historical Provider, MD  NIFEdipine (PROCARDIA XL) 30 MG 24 hr  tablet Take 1 tablet (30 mg total) by mouth daily. To stimulate circulation to the hands 01/12/15  Yes Claretta Fraise, MD  tiZANidine (ZANAFLEX) 4 MG capsule Take 1 capsule (4 mg total) by mouth 3 (three) times daily as needed (headaches). 06/16/15   Aarianna Hoadley, PA-C   BP 121/97 mmHg  Pulse 63  Temp(Src) 98.4 F (36.9 C) (Oral)  Resp 18  SpO2 97% Physical Exam  Constitutional: He is oriented to person, place, and time. He appears well-developed and well-nourished. No distress.  HENT:  Head: Normocephalic and atraumatic.  Right Ear: External ear normal.  Left Ear: External ear normal.  Nose: Nose normal.  Mouth/Throat: Oropharynx is clear and moist. No oropharyngeal exudate.  Eyes: Conjunctivae and EOM are normal. Pupils are equal, round, and reactive to light.  Neck: Normal range of motion. Neck supple.  Cardiovascular: Normal rate, regular rhythm, normal heart sounds and intact distal pulses.   Pulmonary/Chest: Effort normal and breath sounds normal. No respiratory distress.  Abdominal: Soft. There is no tenderness.  Neurological: He is alert and oriented to person, place, and time. He has normal strength. No cranial nerve deficit. Gait normal. GCS eye subscore is 4. GCS verbal subscore is 5. GCS motor subscore is 6.  Sensation grossly intact.  No pronator drift.  Bilateral heel-knee-shin intact.  Skin: Skin is warm and dry. He is not diaphoretic.  Nursing note and vitals reviewed.   ED Course  Procedures (including critical care time) Medications  tiZANidine (ZANAFLEX) tablet 4  mg (4 mg Oral Given 06/15/15 2348)    Labs Review Labs Reviewed  COMPREHENSIVE METABOLIC PANEL - Abnormal; Notable for the following:    Glucose, Bld 106 (*)    AST 61 (*)    All other components within normal limits  CBC WITH DIFFERENTIAL/PLATELET    Imaging Review Ct Head Wo Contrast  06/16/2015   CLINICAL DATA:  Hit top of head 1 week ago with blunt object. Difficulty speaking and  concentrating since then.  EXAM: CT HEAD WITHOUT CONTRAST  TECHNIQUE: Contiguous axial images were obtained from the base of the skull through the vertex without intravenous contrast.  COMPARISON:  None.  FINDINGS: There is no intracranial hemorrhage, mass or evidence of acute infarction. There is no extra-axial fluid collection. Gray matter and white matter appear normal. Cerebral volume is normal for age. Brainstem and posterior fossa are unremarkable. The CSF spaces appear normal.  The bony structures are intact. The visible portions of the paranasal sinuses are clear.  IMPRESSION: Normal brain   Electronically Signed   By: Andreas Newport M.D.   On: 06/16/2015 01:37     EKG Interpretation None      MDM   Final diagnoses:  Concussion, without loss of consciousness, initial encounter    Filed Vitals:   06/16/15 0208  BP:   Pulse:   Temp: 98.4 F (36.9 C)  Resp:    Afebrile, NAD, non-toxic appearing, AAOx4.  GCS 15, A&Ox4, no bleeding from the head, battle signs, or clear discharge resembling CSF fluid.  No focal neurological deficits on physical exam. CT image negative. Pt is hemodynamically stable. Pain managed in the ED. At this time there does not appear to be any evidence of an acute emergency medical condition and the patient appears stable for discharge with appropriate outpatient follow up. Discussed returning to the ED upon presentation of any concerning symptoms and the dangers and symptoms of post-concussive syndrome (including but not limited to severe headaches, disequilibrium/difficulty walking, double vision, difficulty concentrating, sensitivity to light, changes in mood, nausea/vomiting, ongoing dizziness) as well as second-impact syndrome and how that can lead to devastating brain injury. Discussed the importance of patient being symptom free for at least one week and being cleared by their primary care physician before returning to sports and if symptoms return upon  exertion to stop activity immediately and follow up with their doctor or return to ED. Pt verbalized understanding and is agreeable to discharge. Patient is stable at time of discharge   Baron Sane, PA-C 06/16/15 [place], MD 06/16/15 [phone]

## 2015-06-15 NOTE — ED Notes (Addendum)
Pt. reported a garage door fell on his head 1 1/2 weeks ago ,  No LOC , pt. reports " head pressure" with intermittent confusion , alert and oriented at arrival . Speech clear /no facial,asymmetry, ambulatory.

## 2015-06-16 ENCOUNTER — Encounter (HOSPITAL_COMMUNITY): Payer: Self-pay

## 2015-06-16 ENCOUNTER — Emergency Department (HOSPITAL_COMMUNITY): Payer: BLUE CROSS/BLUE SHIELD

## 2015-06-16 MED ORDER — TIZANIDINE HCL 4 MG PO CAPS: 4.0000 mg | ORAL_CAPSULE | Freq: Three times a day (TID) | ORAL | Status: DC | PRN

## 2015-06-16 NOTE — Discharge Instructions (Signed)
Please follow up with your primary care physician in 1-2 days. If you do not have one please call the [facility] number listed above. Please read all discharge instructions and return precautions.    Concussion A concussion, or closed-head injury, is a brain injury caused by a direct blow to the head or by a quick and sudden movement (jolt) of the head or neck. Concussions are usually not life-threatening. Even so, the effects of a concussion can be serious. If you have had a concussion before, you are more likely to experience concussion-like symptoms after a direct blow to the head.  CAUSES  Direct blow to the head, such as from running into another player during a soccer game, being hit in a fight, or hitting your head on a hard surface.  A jolt of the head or neck that causes the brain to move back and forth inside the skull, such as in a car crash. SIGNS AND SYMPTOMS The signs of a concussion can be hard to notice. Early on, they may be missed by you, family members, and health care providers. You may look fine but act or feel differently. Symptoms are usually temporary, but they may last for days, weeks, or even longer. Some symptoms may appear right away while others may not show up for hours or days. Every head injury is different. Symptoms include:  Mild to moderate headaches that will not go away.  A feeling of pressure inside your head.  Having more trouble than usual:  Learning or remembering things you have heard.  Answering questions.  Paying attention or concentrating.  Organizing daily tasks.  Making decisions and solving problems.  Slowness in thinking, acting or reacting, speaking, or reading.  Getting lost or being easily confused.  Feeling tired all the time or lacking energy (fatigued).  Feeling drowsy.  Sleep disturbances.  Sleeping more than usual.  Sleeping less than usual.  Trouble falling asleep.  Trouble sleeping  (insomnia).  Loss of balance or feeling lightheaded or dizzy.  Nausea or vomiting.  Numbness or tingling.  Increased sensitivity to:  Sounds.  Lights.  Distractions.  Vision problems or eyes that tire easily.  Diminished sense of taste or smell.  Ringing in the ears.  Mood changes such as feeling sad or anxious.  Becoming easily irritated or angry for little or no reason.  Lack of motivation.  Seeing or hearing things other people do not see or hear (hallucinations). DIAGNOSIS Your health care provider can usually diagnose a concussion based on a description of your injury and symptoms. He or she will ask whether you passed out (lost consciousness) and whether you are having trouble remembering events that happened right before and during your injury. Your evaluation might include:  A brain scan to look for signs of injury to the brain. Even if the test shows no injury, you may still have a concussion.  Blood tests to be sure other problems are not present. TREATMENT  Concussions are usually treated in an emergency department, in urgent care, or at a clinic. You may need to stay in the hospital overnight for further treatment.  Tell your health care provider if you are taking any medicines, including prescription medicines, over-the-counter medicines, and natural remedies. Some medicines, such as blood thinners (anticoagulants) and aspirin, may increase the chance of complications. Also tell your health care provider whether you have had alcohol or are taking illegal drugs. This information may affect treatment.  Your health care  provider will send you home with important instructions to follow.  How fast you will recover from a concussion depends on many factors. These factors include how severe your concussion is, what part of your brain was injured, your age, and how healthy you were before the concussion.  Most people with mild injuries recover fully. Recovery can  take time. In general, recovery is slower in older persons. Also, persons who have had a concussion in the past or have other medical problems may find that it takes longer to recover from their current injury. HOME CARE INSTRUCTIONS General Instructions  Carefully follow the directions your health care provider gave you.  Only take over-the-counter or prescription medicines for pain, discomfort, or fever as directed by your health care provider.  Take only those medicines that your health care provider has approved.  Do not drink alcohol until your health care provider says you are well enough to do so. Alcohol and certain other drugs may slow your recovery and can put you at risk of further injury.  If it is harder than usual to remember things, write them down.  If you are easily distracted, try to do one thing at a time. For example, do not try to watch TV while fixing dinner.  Talk with family members or close friends when making important decisions.  Keep all follow-up appointments. Repeated evaluation of your symptoms is recommended for your recovery.  Watch your symptoms and tell others to do the same. Complications sometimes occur after a concussion. Older adults with a brain injury may have a higher risk of serious complications, such as a blood clot on the brain.  Tell your teachers, school nurse, school counselor, coach, athletic trainer, or work Freight forwarder about your injury, symptoms, and restrictions. Tell them about what you can or cannot do. They should watch for:  Increased problems with attention or concentration.  Increased difficulty remembering or learning new information.  Increased time needed to complete tasks or assignments.  Increased irritability or decreased ability to cope with stress.  Increased symptoms.  Rest. Rest helps the brain to heal. Make sure you:  Get plenty of sleep at night. Avoid staying up late at night.  Keep the same bedtime hours on  weekends and weekdays.  Rest during the day. Take daytime naps or rest breaks when you feel tired.  Limit activities that require a lot of thought or concentration. These include:  Doing homework or job-related work.  Watching TV.  Working on the computer.  Avoid any situation where there is potential for another head injury (football, hockey, soccer, basketball, martial arts, downhill snow sports and horseback riding). Your condition will get worse every time you experience a concussion. You should avoid these activities until you are evaluated by the appropriate follow-up health care providers. Returning To Your Regular Activities You will need to return to your normal activities slowly, not all at once. You must give your body and brain enough time for recovery.  Do not return to sports or other athletic activities until your health care provider tells you it is safe to do so.  Ask your health care provider when you can drive, ride a bicycle, or operate heavy machinery. Your ability to react may be slower after a brain injury. Never do these activities if you are dizzy.  Ask your health care provider about when you can return to work or school. Preventing Another Concussion It is very important to avoid another brain injury, especially before you  have recovered. In rare cases, another injury can lead to permanent brain damage, brain swelling, or death. The risk of this is greatest during the first 7-10 days after a head injury. Avoid injuries by:  Wearing a seat belt when riding in a car.  Drinking alcohol only in moderation.  Wearing a helmet when biking, skiing, skateboarding, skating, or doing similar activities.  Avoiding activities that could lead to a second concussion, such as contact or recreational sports, until your health care provider says it is okay.  Taking safety measures in your home.  Remove clutter and tripping hazards from floors and stairways.  Use grab bars  in bathrooms and handrails by stairs.  Place non-slip mats on floors and in bathtubs.  Improve lighting in dim areas. SEEK MEDICAL CARE IF:  You have increased problems paying attention or concentrating.  You have increased difficulty remembering or learning new information.  You need more time to complete tasks or assignments than before.  You have increased irritability or decreased ability to cope with stress.  You have more symptoms than before. Seek medical care if you have any of the following symptoms for more than 2 weeks after your injury:  Lasting (chronic) headaches.  Dizziness or balance problems.  Nausea.  Vision problems.  Increased sensitivity to noise or light.  Depression or mood swings.  Anxiety or irritability.  Memory problems.  Difficulty concentrating or paying attention.  Sleep problems.  Feeling tired all the time. SEEK IMMEDIATE MEDICAL CARE IF:  You have severe or worsening headaches. These may be a sign of a blood clot in the brain.  You have weakness (even if only in one hand, leg, or part of the face).  You have numbness.  You have decreased coordination.  You vomit repeatedly.  You have increased sleepiness.  One pupil is larger than the other.  You have convulsions.  You have slurred speech.  You have increased confusion. This may be a sign of a blood clot in the brain.  You have increased restlessness, agitation, or irritability.  You are unable to recognize people or places.  You have neck pain.  It is difficult to wake you up.  You have unusual behavior changes.  You lose consciousness. MAKE SURE YOU:  Understand these instructions.  Will watch your condition.  Will get help right away if you are not doing well or get worse. Document Released: 02/25/2004 Document Revised: 12/10/2013 Document Reviewed: 06/27/2013 [facility] Patient Information 2015 [place], [place]. This information is not intended to  replace advice given to you by your health care provider. Make sure you discuss any questions you have with your health care provider.

## 2015-06-25 ENCOUNTER — Encounter: Payer: Self-pay | Admitting: Physician Assistant

## 2015-06-25 ENCOUNTER — Ambulatory Visit (INDEPENDENT_AMBULATORY_CARE_PROVIDER_SITE_OTHER): Payer: BLUE CROSS/BLUE SHIELD | Admitting: Physician Assistant

## 2015-06-25 VITALS — BP 151/96 | HR 69 | Temp 97.8°F | Wt 245.6 lb

## 2015-06-25 DIAGNOSIS — IMO0001 Reserved for inherently not codable concepts without codable children: Secondary | ICD-10-CM

## 2015-06-25 DIAGNOSIS — R030 Elevated blood-pressure reading, without diagnosis of hypertension: Secondary | ICD-10-CM

## 2015-06-25 DIAGNOSIS — S060X0D Concussion without loss of consciousness, subsequent encounter: Secondary | ICD-10-CM | POA: Diagnosis not present

## 2015-06-25 DIAGNOSIS — H6123 Impacted cerumen, bilateral: Secondary | ICD-10-CM | POA: Diagnosis not present

## 2015-06-25 NOTE — Patient Instructions (Signed)
Concussion  A concussion, or closed-head injury, is a brain injury caused by a direct blow to the head or by a quick and sudden movement (jolt) of the head or neck. Concussions are usually not life-threatening. Even so, the effects of a concussion can be serious. If you have had a concussion before, you are more likely to experience concussion-like symptoms after a direct blow to the head.   CAUSES  · Direct blow to the head, such as from running into another player during a soccer game, being hit in a fight, or hitting your head on a hard surface.  · A jolt of the head or neck that causes the brain to move back and forth inside the skull, such as in a car crash.  SIGNS AND SYMPTOMS  The signs of a concussion can be hard to notice. Early on, they may be missed by you, family members, and health care providers. You may look fine but act or feel differently.  Symptoms are usually temporary, but they may last for days, weeks, or even longer. Some symptoms may appear right away while others may not show up for hours or days. Every head injury is different. Symptoms include:  · Mild to moderate headaches that will not go away.  · A feeling of pressure inside your head.  · Having more trouble than usual:  ¨ Learning or remembering things you have heard.  ¨ Answering questions.  ¨ Paying attention or concentrating.  ¨ Organizing daily tasks.  ¨ Making decisions and solving problems.  · Slowness in thinking, acting or reacting, speaking, or reading.  · Getting lost or being easily confused.  · Feeling tired all the time or lacking energy (fatigued).  · Feeling drowsy.  · Sleep disturbances.  ¨ Sleeping more than usual.  ¨ Sleeping less than usual.  ¨ Trouble falling asleep.  ¨ Trouble sleeping (insomnia).  · Loss of balance or feeling lightheaded or dizzy.  · Nausea or vomiting.  · Numbness or tingling.  · Increased sensitivity to:  ¨ Sounds.  ¨ Lights.  ¨ Distractions.  · Vision problems or eyes that tire  easily.  · Diminished sense of taste or smell.  · Ringing in the ears.  · Mood changes such as feeling sad or anxious.  · Becoming easily irritated or angry for little or no reason.  · Lack of motivation.  · Seeing or hearing things other people do not see or hear (hallucinations).  DIAGNOSIS  Your health care provider can usually diagnose a concussion based on a description of your injury and symptoms. He or she will ask whether you passed out (lost consciousness) and whether you are having trouble remembering events that happened right before and during your injury.  Your evaluation might include:  · A brain scan to look for signs of injury to the brain. Even if the test shows no injury, you may still have a concussion.  · Blood tests to be sure other problems are not present.  TREATMENT  · Concussions are usually treated in an emergency department, in urgent care, or at a clinic. You may need to stay in the hospital overnight for further treatment.  · Tell your health care provider if you are taking any medicines, including prescription medicines, over-the-counter medicines, and natural remedies. Some medicines, such as blood thinners (anticoagulants) and aspirin, may increase the chance of complications. Also tell your health care provider whether you have had alcohol or are taking illegal drugs. This information   may affect treatment.  · Your health care provider will send you home with important instructions to follow.  · How fast you will recover from a concussion depends on many factors. These factors include how severe your concussion is, what part of your brain was injured, your age, and how healthy you were before the concussion.  · Most people with mild injuries recover fully. Recovery can take time. In general, recovery is slower in older persons. Also, persons who have had a concussion in the past or have other medical problems may find that it takes longer to recover from their current injury.  HOME  CARE INSTRUCTIONS  General Instructions  · Carefully follow the directions your health care provider gave you.  · Only take over-the-counter or prescription medicines for pain, discomfort, or fever as directed by your health care provider.  · Take only those medicines that your health care provider has approved.  · Do not drink alcohol until your health care provider says you are well enough to do so. Alcohol and certain other drugs may slow your recovery and can put you at risk of further injury.  · If it is harder than usual to remember things, write them down.  · If you are easily distracted, try to do one thing at a time. For example, do not try to watch TV while fixing dinner.  · Talk with family members or close friends when making important decisions.  · Keep all follow-up appointments. Repeated evaluation of your symptoms is recommended for your recovery.  · Watch your symptoms and tell others to do the same. Complications sometimes occur after a concussion. Older adults with a brain injury may have a higher risk of serious complications, such as a blood clot on the brain.  · Tell your teachers, school nurse, school counselor, coach, athletic trainer, or work manager about your injury, symptoms, and restrictions. Tell them about what you can or cannot do. They should watch for:  ¨ Increased problems with attention or concentration.  ¨ Increased difficulty remembering or learning new information.  ¨ Increased time needed to complete tasks or assignments.  ¨ Increased irritability or decreased ability to cope with stress.  ¨ Increased symptoms.  · Rest. Rest helps the brain to heal. Make sure you:  ¨ Get plenty of sleep at night. Avoid staying up late at night.  ¨ Keep the same bedtime hours on weekends and weekdays.  ¨ Rest during the day. Take daytime naps or rest breaks when you feel tired.  · Limit activities that require a lot of thought or concentration. These include:  ¨ Doing homework or job-related  work.  ¨ Watching TV.  ¨ Working on the computer.  · Avoid any situation where there is potential for another head injury (football, hockey, soccer, basketball, martial arts, downhill snow sports and horseback riding). Your condition will get worse every time you experience a concussion. You should avoid these activities until you are evaluated by the appropriate follow-up health care providers.  Returning To Your Regular Activities  You will need to return to your normal activities slowly, not all at once. You must give your body and brain enough time for recovery.  · Do not return to sports or other athletic activities until your health care provider tells you it is safe to do so.  · Ask your health care provider when you can drive, ride a bicycle, or operate heavy machinery. Your ability to react may be slower after a   brain injury. Never do these activities if you are dizzy.  · Ask your health care provider about when you can return to work or school.  Preventing Another Concussion  It is very important to avoid another brain injury, especially before you have recovered. In rare cases, another injury can lead to permanent brain damage, brain swelling, or death. The risk of this is greatest during the first 7-10 days after a head injury. Avoid injuries by:  · Wearing a seat belt when riding in a car.  · Drinking alcohol only in moderation.  · Wearing a helmet when biking, skiing, skateboarding, skating, or doing similar activities.  · Avoiding activities that could lead to a second concussion, such as contact or recreational sports, until your health care provider says it is okay.  · Taking safety measures in your home.  ¨ Remove clutter and tripping hazards from floors and stairways.  ¨ Use grab bars in bathrooms and handrails by stairs.  ¨ Place non-slip mats on floors and in bathtubs.  ¨ Improve lighting in dim areas.  SEEK MEDICAL CARE IF:  · You have increased problems paying attention or  concentrating.  · You have increased difficulty remembering or learning new information.  · You need more time to complete tasks or assignments than before.  · You have increased irritability or decreased ability to cope with stress.  · You have more symptoms than before.  Seek medical care if you have any of the following symptoms for more than 2 weeks after your injury:  · Lasting (chronic) headaches.  · Dizziness or balance problems.  · Nausea.  · Vision problems.  · Increased sensitivity to noise or light.  · Depression or mood swings.  · Anxiety or irritability.  · Memory problems.  · Difficulty concentrating or paying attention.  · Sleep problems.  · Feeling tired all the time.  SEEK IMMEDIATE MEDICAL CARE IF:  · You have severe or worsening headaches. These may be a sign of a blood clot in the brain.  · You have weakness (even if only in one hand, leg, or part of the face).  · You have numbness.  · You have decreased coordination.  · You vomit repeatedly.  · You have increased sleepiness.  · One pupil is larger than the other.  · You have convulsions.  · You have slurred speech.  · You have increased confusion. This may be a sign of a blood clot in the brain.  · You have increased restlessness, agitation, or irritability.  · You are unable to recognize people or places.  · You have neck pain.  · It is difficult to wake you up.  · You have unusual behavior changes.  · You lose consciousness.  MAKE SURE YOU:  · Understand these instructions.  · Will watch your condition.  · Will get help right away if you are not doing well or get worse.  Document Released: 02/25/2004 Document Revised: 12/10/2013 Document Reviewed: 06/27/2013  [employer]® Patient Information ©2015 [employer]. This information is not intended to replace advice given to you by your health care provider. Make sure you discuss any questions you have with your health care provider.

## 2015-06-25 NOTE — Progress Notes (Signed)
Subjective:     Patient ID: Danny Snyder, male   DOB: 05-Mar-1966, 49 y.o.   MRN: [id number]  HPI Pt here for f/u of ER visit Pt went due to a garage door falling down on his head He had full WU to include CT scan and given dx of concussion While there it was noted his BP was elevated Informed to f/u on this He continues with some vertigo and noted fullness to both ears Not sure if this may be due to swimmers ear or the concussion He also brought BP readings form home They vary from 120/76 to 150/92  Review of Systems  Constitutional: Positive for activity change. Negative for appetite change and fatigue.  HENT: Negative.   Respiratory: Negative.   Cardiovascular: Negative.   Neurological: Positive for dizziness and light-headedness. Negative for tremors, seizures, syncope, speech difficulty, weakness, numbness and headaches.       Objective:   Physical Exam  Constitutional: He is oriented to person, place, and time. He appears well-developed and well-nourished.  HENT:  Head: Normocephalic and atraumatic.  Right Ear: External ear normal.  Left Ear: External ear normal.  Mouth/Throat: Oropharynx is clear and moist.  Both canals impacted with cerumen  Neck: Normal range of motion. Neck supple.  Cardiovascular: Normal rate, regular rhythm and normal heart sounds.   Pulmonary/Chest: Effort normal and breath sounds normal.  Lymphadenopathy:    He has no cervical adenopathy.  Neurological: He is alert and oriented to person, place, and time. He has normal reflexes. He displays normal reflexes. No cranial nerve deficit. Coordination normal.  Psychiatric: He has a normal mood and affect. His behavior is normal. Judgment and thought content normal.  Nursing note and vitals reviewed.  Both ears irrigated with removal of cerumen plugs Following irrigation TM's nl bilat    Assessment:     Elevated BP Impacted cerumen S/P concussion    Plan:     Continue with hydration and  rest Nl course of concussion reviewed BP log sheet given to pt Recheck in 1 month on BP No Qtip use Ear wax softener monthly F/U in 1 month

## 2015-06-28 ENCOUNTER — Other Ambulatory Visit: Payer: Self-pay | Admitting: Family Medicine

## 2015-07-16 ENCOUNTER — Telehealth: Payer: Self-pay | Admitting: *Deleted

## 2015-07-16 MED ORDER — NIFEDIPINE ER OSMOTIC RELEASE 30 MG PO TB24: 30.0000 mg | ORAL_TABLET | Freq: Every day | ORAL | Status: DC

## 2015-07-16 NOTE — Telephone Encounter (Signed)
Med refill

## 2015-07-28 ENCOUNTER — Ambulatory Visit: Payer: BLUE CROSS/BLUE SHIELD | Admitting: Family Medicine

## 2015-08-11 ENCOUNTER — Other Ambulatory Visit: Payer: Self-pay | Admitting: *Deleted

## 2015-08-11 MED ORDER — NIFEDIPINE ER OSMOTIC RELEASE 30 MG PO TB24: 30.0000 mg | ORAL_TABLET | Freq: Every day | ORAL | Status: DC

## 2015-09-16 ENCOUNTER — Ambulatory Visit (INDEPENDENT_AMBULATORY_CARE_PROVIDER_SITE_OTHER): Payer: BLUE CROSS/BLUE SHIELD | Admitting: Physician Assistant

## 2015-09-16 ENCOUNTER — Ambulatory Visit (INDEPENDENT_AMBULATORY_CARE_PROVIDER_SITE_OTHER): Payer: BLUE CROSS/BLUE SHIELD

## 2015-09-16 ENCOUNTER — Telehealth: Payer: Self-pay | Admitting: Physician Assistant

## 2015-09-16 ENCOUNTER — Encounter: Payer: Self-pay | Admitting: Physician Assistant

## 2015-09-16 VITALS — BP 132/82 | HR 65 | Temp 97.8°F | Ht 77.0 in | Wt 241.0 lb

## 2015-09-16 DIAGNOSIS — R1084 Generalized abdominal pain: Secondary | ICD-10-CM | POA: Diagnosis not present

## 2015-09-16 DIAGNOSIS — R142 Eructation: Secondary | ICD-10-CM

## 2015-09-16 DIAGNOSIS — R112 Nausea with vomiting, unspecified: Secondary | ICD-10-CM

## 2015-09-16 MED ORDER — OMEPRAZOLE 40 MG PO CPDR: 40.0000 mg | DELAYED_RELEASE_CAPSULE | Freq: Every day | ORAL | Status: DC

## 2015-09-16 MED ORDER — ONDANSETRON HCL 4 MG PO TABS: 4.0000 mg | ORAL_TABLET | Freq: Three times a day (TID) | ORAL | Status: DC | PRN

## 2015-09-16 NOTE — Progress Notes (Signed)
   Subjective:    Patient ID: Danny Snyder, male    DOB: January 04, 1966, 49 y.o.   MRN: [id number]  HPI 49 y/o male presents with c/o worsening abdominal pain, nausea, belching and diarrhea x 6 days.. Has tried gas ex, pepto bismol, alka seltzer, and rolaids with no relief. No aggravating or relieving factors. No sick contacts with similar symptoms.       Review of Systems  Constitutional: Positive for chills. Negative for fever.  HENT: Negative.   Eyes: Negative.   Respiratory: Negative.   Cardiovascular: Negative.   Gastrointestinal: Positive for nausea, vomiting, abdominal pain (perinavicular bilateral , intermittent) and diarrhea. Negative for blood in stool.  Endocrine: Negative.  Negative for polyuria.  Genitourinary:       Felt "pressure" last night when he was having abdominal pain and felt like he needed to urinate  Musculoskeletal: Negative for back pain.  Skin: Negative.   Allergic/Immunologic: Negative.   Psychiatric/Behavioral: Negative.        Objective:   Physical Exam  Constitutional: He is oriented to person, place, and time. He appears well-developed and well-nourished.  KUB indicates relative paucity of bowel gas. No definite acute abnormality or bowel obstruction identifieds   Abdominal: Soft. Bowel sounds are normal. He exhibits no distension and no mass. There is no tenderness. There is no rebound and no guarding.  Neurological: He is alert and oriented to person, place, and time.  Nursing note and vitals reviewed.         Assessment & Plan:  1. Generalized abdominal pain  - DG Abd 1 View - CBC with Differential/Platelet - CMP14+EGFR - Lipase - Amylase - H Pylori, IGM, IGG, IGA AB - Ambulatory referral to Gastroenterology - omeprazole (PRILOSEC) 40 MG capsule; Take 1 capsule (40 mg total) by mouth daily.  Dispense: 30 capsule; Refill: 3  2. Nausea and vomiting, vomiting of unspecified type  - DG Abd 1 View - CBC with Differential/Platelet -  CMP14+EGFR - Lipase - Amylase - H Pylori, IGM, IGG, IGA AB - Ambulatory referral to Gastroenterology - ondansetron (ZOFRAN) 4 MG tablet; Take 1 tablet (4 mg total) by mouth every 8 (eight) hours as needed for nausea or vomiting.  Dispense: 20 tablet; Refill: 0  3. Belching  - DG Abd 1 View - CBC with Differential/Platelet - CMP14+EGFR - Lipase - Amylase - H Pylori, IGM, IGG, IGA AB - Ambulatory referral to Gastroenterology - omeprazole (PRILOSEC) 40 MG capsule; Take 1 capsule (40 mg total) by mouth daily.  Dispense: 30 capsule; Refill: 3   If symptoms worsen, report to ED or call office and a u/s of gallbladder will be ordered to r/o cholelithiasis.   Tiffany A. Benjamin Stain PA-C

## 2015-09-17 LAB — CBC WITH DIFFERENTIAL/PLATELET
Basophils Absolute: 0 10*3/uL (ref 0.0–0.2)
Basos: 0 %
EOS (ABSOLUTE): 0.1 10*3/uL (ref 0.0–0.4)
Eos: 2 %
Hematocrit: 45.8 % (ref 37.5–51.0)
Hemoglobin: 15.7 g/dL (ref 12.6–17.7)
IMMATURE GRANS (ABS): 0 10*3/uL (ref 0.0–0.1)
IMMATURE GRANULOCYTES: 0 %
LYMPHS: 22 %
Lymphocytes Absolute: 1.2 10*3/uL (ref 0.7–3.1)
MCH: 31.8 pg (ref 26.6–33.0)
MCHC: 34.3 g/dL (ref 31.5–35.7)
MCV: 93 fL (ref 79–97)
MONOS ABS: 0.5 10*3/uL (ref 0.1–0.9)
Monocytes: 10 %
Neutrophils Absolute: 3.7 10*3/uL (ref 1.4–7.0)
Neutrophils: 66 %
PLATELETS: 207 10*3/uL (ref 150–379)
RBC: 4.94 x10E6/uL (ref 4.14–5.80)
RDW: 14 % (ref 12.3–15.4)
WBC: 5.6 10*3/uL (ref 3.4–10.8)

## 2015-09-17 LAB — SPECIMEN STATUS REPORT

## 2015-09-18 LAB — CMP14+EGFR
ALT: 67 IU/L — AB (ref 0–44)
AST: 54 IU/L — AB (ref 0–40)
Albumin/Globulin Ratio: 2 (ref 1.1–2.5)
Albumin: 4.7 g/dL (ref 3.5–5.5)
Alkaline Phosphatase: 65 IU/L (ref 39–117)
BILIRUBIN TOTAL: 0.5 mg/dL (ref 0.0–1.2)
BUN/Creatinine Ratio: 12 (ref 9–20)
BUN: 13 mg/dL (ref 6–24)
CALCIUM: 9.8 mg/dL (ref 8.7–10.2)
CHLORIDE: 98 mmol/L (ref 97–108)
CO2: 23 mmol/L (ref 18–29)
Creatinine, Ser: 1.09 mg/dL (ref 0.76–1.27)
GFR calc Af Amer: 92 mL/min/{1.73_m2} (ref >59)
GFR, EST NON AFRICAN AMERICAN: 80 mL/min/{1.73_m2} (ref >59)
GLUCOSE: 99 mg/dL (ref 65–99)
Globulin, Total: 2.3 g/dL (ref 1.5–4.5)
Potassium: 4.6 mmol/L (ref 3.5–5.2)
Sodium: 138 mmol/L (ref 134–144)
Total Protein: 7 g/dL (ref 6.0–8.5)

## 2015-09-18 LAB — H PYLORI, IGM, IGG, IGA AB
H pylori, IgM Abs: <9 units (ref 0.0–8.9)
H. pylori, IgA Abs: <9 units (ref 0.0–8.9)

## 2015-09-18 LAB — AMYLASE: AMYLASE: 46 U/L (ref 31–124)

## 2015-09-18 LAB — LIPASE: Lipase: 27 U/L (ref 0–59)

## 2015-09-18 NOTE — Telephone Encounter (Signed)
Please advise 

## 2015-09-19 NOTE — Telephone Encounter (Signed)
Called patient this morning. Talked to wife. He went to the ED on Thursday and was treated for bacterial infection.  Aaric Dolph A. Benjamin Stain PA-C

## 2015-10-01 ENCOUNTER — Other Ambulatory Visit: Payer: Self-pay | Admitting: Physician Assistant

## 2015-12-23 ENCOUNTER — Ambulatory Visit (INDEPENDENT_AMBULATORY_CARE_PROVIDER_SITE_OTHER): Payer: BLUE CROSS/BLUE SHIELD | Admitting: Family Medicine

## 2015-12-23 ENCOUNTER — Encounter: Payer: Self-pay | Admitting: Family Medicine

## 2015-12-23 VITALS — BP 148/88 | HR 98 | Temp 98.0°F | Ht 77.0 in | Wt 250.0 lb

## 2015-12-23 DIAGNOSIS — J0101 Acute recurrent maxillary sinusitis: Secondary | ICD-10-CM

## 2015-12-23 MED ORDER — AMOXICILLIN-POT CLAVULANATE 875-125 MG PO TABS: 1.0000 | ORAL_TABLET | Freq: Two times a day (BID) | ORAL | Status: DC

## 2015-12-23 MED ORDER — PSEUDOEPHEDRINE-GUAIFENESIN ER 120-1200 MG PO TB12: 1.0000 | ORAL_TABLET | Freq: Two times a day (BID) | ORAL | Status: DC

## 2015-12-23 MED ORDER — MELOXICAM 15 MG PO TABS: ORAL_TABLET | ORAL | Status: DC

## 2015-12-23 MED ORDER — NIFEDIPINE ER OSMOTIC RELEASE 30 MG PO TB24: 30.0000 mg | ORAL_TABLET | Freq: Every day | ORAL | Status: DC

## 2015-12-23 NOTE — Progress Notes (Signed)
Subjective:  Patient ID: Danny Snyder, male    DOB: Dec 16, 1966  Age: 50 y.o. MRN: [id number]  CC: Sinusitis   HPI Danny Snyder presents for Symptoms include congestion, facial pain, nasal congestion, no  fever, non productive cough, post nasal drip and sinus pressure with no fever, chills, night sweats or weight loss. Onset of symptoms was a few days ago, gradually worsening since that time. Pt.is drinking moderate amounts of fluids.     History Danny Snyder has a past medical history of Anxiety.   He has no past surgical history on file.   His family history is not on file.He reports that he has never smoked. He does not have any smokeless tobacco history on file. He reports that he drinks alcohol. He reports that he does not use illicit drugs.  Outpatient Prescriptions Prior to Visit  Medication Sig Dispense Refill  . aspirin 81 MG tablet Take 81 mg by mouth daily.    Marland Kitchen FIBER PO Take 3 capsules by mouth 2 (two) times daily.     . Glucosamine-Chondroitin (OSTEO BI-FLEX REGULAR STRENGTH PO) Take by mouth.    . lansoprazole (PREVACID) 30 MG capsule Take 30 mg by mouth daily at 12 noon.    Marland Kitchen omeprazole (PRILOSEC) 40 MG capsule Take 1 capsule (40 mg total) by mouth daily. 30 capsule 3  . ST JOHNS WORT PO Take by mouth.    . meloxicam (MOBIC) 15 MG tablet TAKE 1 TABLET (15 MG TOTAL) BY MOUTH DAILY. WITH FOOD 30 tablet 2  . NIFEdipine (PROCARDIA XL) 30 MG 24 hr tablet Take 1 tablet (30 mg total) by mouth daily. 30 tablet 4  . ondansetron (ZOFRAN) 4 MG tablet Take 1 tablet (4 mg total) by mouth every 8 (eight) hours as needed for nausea or vomiting. 20 tablet 0   No facility-administered medications prior to visit.    ROS Review of Systems  Constitutional: Negative for fever, chills, activity change and appetite change.  HENT: Positive for congestion, postnasal drip, rhinorrhea and sinus pressure. Negative for ear discharge, ear pain, hearing loss, nosebleeds, sneezing and trouble  swallowing.   Respiratory: Negative for chest tightness and shortness of breath.   Cardiovascular: Negative for chest pain and palpitations.  Skin: Negative for rash.    Objective:  BP 148/88 mmHg  Pulse 98  Temp(Src) 98 F (36.7 C) (Oral)  Ht 6\' 5"  (1.956 m)  Wt 250 lb (113.399 kg)  BMI 29.64 kg/m2  SpO2 98%  BP Readings from Last 3 Encounters:  12/23/15 148/88  09/16/15 132/82  06/25/15 151/96    Wt Readings from Last 3 Encounters:  12/23/15 250 lb (113.399 kg)  09/16/15 241 lb (109.317 kg)  06/25/15 245 lb 9.6 oz (111.403 kg)     Physical Exam  Constitutional: He appears well-developed and well-nourished.  HENT:  Head: Normocephalic and atraumatic.  Right Ear: Tympanic membrane and external ear normal. No decreased hearing is noted.  Left Ear: Tympanic membrane and external ear normal. No decreased hearing is noted.  Nose: Mucosal edema present. Right sinus exhibits no frontal sinus tenderness. Left sinus exhibits no frontal sinus tenderness.  Mouth/Throat: No oropharyngeal exudate or posterior oropharyngeal erythema.  Neck: No Brudzinski's sign noted.  Pulmonary/Chest: Breath sounds normal. No respiratory distress.  Lymphadenopathy:       Head (right side): No preauricular adenopathy present.       Head (left side): No preauricular adenopathy present.       Right cervical: No superficial  cervical adenopathy present.      Left cervical: No superficial cervical adenopathy present.     Lab Results  Component Value Date   WBC 5.6 09/16/2015   HGB 15.4 06/15/2015   HCT 45.8 09/16/2015   PLT 207 09/16/2015   GLUCOSE 99 09/16/2015   ALT 67* 09/16/2015   AST 54* 09/16/2015   NA 138 09/16/2015   K 4.6 09/16/2015   CL 98 09/16/2015   CREATININE 1.09 09/16/2015   BUN 13 09/16/2015   CO2 23 09/16/2015    Ct Head Wo Contrast  06/16/2015  CLINICAL DATA:  Hit top of head 1 week ago with blunt object. Difficulty speaking and concentrating since then. EXAM: CT HEAD  WITHOUT CONTRAST TECHNIQUE: Contiguous axial images were obtained from the base of the skull through the vertex without intravenous contrast. COMPARISON:  None. FINDINGS: There is no intracranial hemorrhage, mass or evidence of acute infarction. There is no extra-axial fluid collection. Gray matter and white matter appear normal. Cerebral volume is normal for age. Brainstem and posterior fossa are unremarkable. The CSF spaces appear normal. The bony structures are intact. The visible portions of the paranasal sinuses are clear. IMPRESSION: Normal brain Electronically Signed   By: Andreas Newport M.D.   On: 06/16/2015 01:37    Assessment & Plan:   Danny Snyder was seen today for sinusitis.  Diagnoses and all orders for this visit:  Acute recurrent maxillary sinusitis  Other orders -     amoxicillin-clavulanate (AUGMENTIN) 875-125 MG tablet; Take 1 tablet by mouth 2 (two) times daily. Take all of this medication -     Pseudoephedrine-Guaifenesin [phone] MG TB12; Take 1 tablet by mouth 2 (two) times daily. For congestion -     NIFEdipine (PROCARDIA XL) 30 MG 24 hr tablet; Take 1 tablet (30 mg total) by mouth daily. -     meloxicam (MOBIC) 15 MG tablet; TAKE 1 TABLET (15 MG TOTAL) BY MOUTH DAILY. WITH FOOD   I have discontinued Danny Snyder's ondansetron. I am also having him start on amoxicillin-clavulanate and Pseudoephedrine-Guaifenesin. Additionally, I am having him maintain his FIBER PO, Glucosamine-Chondroitin (OSTEO BI-FLEX REGULAR STRENGTH PO), lansoprazole, aspirin, ST JOHNS WORT PO, omeprazole, NIFEdipine, and meloxicam.  Meds ordered this encounter  Medications  . amoxicillin-clavulanate (AUGMENTIN) 875-125 MG tablet    Sig: Take 1 tablet by mouth 2 (two) times daily. Take all of this medication    Dispense:  20 tablet    Refill:  0  . Pseudoephedrine-Guaifenesin [phone] MG TB12    Sig: Take 1 tablet by mouth 2 (two) times daily. For congestion    Dispense:  20 each    Refill:  0  .  NIFEdipine (PROCARDIA XL) 30 MG 24 hr tablet    Sig: Take 1 tablet (30 mg total) by mouth daily.    Dispense:  30 tablet    Refill:  4  . meloxicam (MOBIC) 15 MG tablet    Sig: TAKE 1 TABLET (15 MG TOTAL) BY MOUTH DAILY. WITH FOOD    Dispense:  30 tablet    Refill:  2     Follow-up: Return if symptoms worsen or fail to improve.  Claretta Fraise, M.D.

## 2015-12-23 NOTE — Patient Instructions (Addendum)
Return soon at your convenience for complete physical exam.

## 2016-01-06 ENCOUNTER — Other Ambulatory Visit: Payer: Self-pay | Admitting: Physician Assistant

## 2016-01-12 ENCOUNTER — Telehealth: Payer: Self-pay | Admitting: Family Medicine

## 2016-01-12 DIAGNOSIS — M1710 Unilateral primary osteoarthritis, unspecified knee: Secondary | ICD-10-CM

## 2016-01-13 NOTE — Telephone Encounter (Signed)
Patient aware referral placed.

## 2016-05-05 ENCOUNTER — Other Ambulatory Visit: Payer: Self-pay | Admitting: Family Medicine

## 2016-06-01 DIAGNOSIS — M150 Primary generalized (osteo)arthritis: Secondary | ICD-10-CM | POA: Diagnosis not present

## 2016-06-01 DIAGNOSIS — M2550 Pain in unspecified joint: Secondary | ICD-10-CM | POA: Diagnosis not present

## 2016-06-01 DIAGNOSIS — I7300 Raynaud's syndrome without gangrene: Secondary | ICD-10-CM | POA: Diagnosis not present

## 2016-06-02 ENCOUNTER — Other Ambulatory Visit: Payer: Self-pay | Admitting: Family Medicine

## 2016-06-03 ENCOUNTER — Other Ambulatory Visit: Payer: Self-pay | Admitting: Family Medicine

## 2016-06-26 ENCOUNTER — Other Ambulatory Visit: Payer: Self-pay | Admitting: Family Medicine

## 2016-06-27 NOTE — Telephone Encounter (Signed)
RX sent into [facility]

## 2016-06-27 NOTE — Telephone Encounter (Signed)
Last seen 12/23/15  Dr Livia Snellen

## 2016-06-27 NOTE — Telephone Encounter (Signed)
Authorize 30 days only. Then contact the patient letting them know that they will need an appointment before any further prescriptions can be sent in. 

## 2016-07-12 DIAGNOSIS — M2550 Pain in unspecified joint: Secondary | ICD-10-CM | POA: Diagnosis not present

## 2016-07-20 ENCOUNTER — Other Ambulatory Visit: Payer: Self-pay | Admitting: Family Medicine

## 2016-07-21 ENCOUNTER — Telehealth: Payer: Self-pay | Admitting: Family Medicine

## 2016-07-21 MED ORDER — OMEPRAZOLE 40 MG PO CPDR: DELAYED_RELEASE_CAPSULE | ORAL | 0 refills | Status: DC

## 2016-07-21 MED ORDER — NIFEDIPINE ER 30 MG PO TB24: ORAL_TABLET | ORAL | 0 refills | Status: DC

## 2016-07-21 NOTE — Telephone Encounter (Signed)
done

## 2016-08-14 DIAGNOSIS — Z23 Encounter for immunization: Secondary | ICD-10-CM | POA: Diagnosis not present

## 2016-08-21 ENCOUNTER — Other Ambulatory Visit: Payer: Self-pay | Admitting: Family Medicine

## 2016-08-23 ENCOUNTER — Other Ambulatory Visit: Payer: Self-pay | Admitting: Family Medicine

## 2016-08-23 NOTE — Telephone Encounter (Signed)
Authorize 30 days only. Then contact the patient letting them know that they will need an appointment before any further prescriptions can be sent in. 

## 2016-09-16 ENCOUNTER — Other Ambulatory Visit: Payer: Self-pay

## 2016-09-16 MED ORDER — NIFEDIPINE ER 30 MG PO TB24: ORAL_TABLET | ORAL | 0 refills | Status: DC

## 2016-09-16 NOTE — Telephone Encounter (Signed)
Authorize 30 days only. Then contact the patient letting them know that they will need an appointment before any further prescriptions can be sent in. 

## 2016-09-21 ENCOUNTER — Other Ambulatory Visit: Payer: Self-pay | Admitting: Family Medicine

## 2016-09-21 NOTE — Telephone Encounter (Signed)
Left message rx requested has been refilled but will need to be seen for any further refills.

## 2016-09-21 NOTE — Telephone Encounter (Signed)
Authorize 30 days only. Then contact the patient letting them know that they will need an appointment before any further prescriptions can be sent in. 

## 2016-12-05 ENCOUNTER — Encounter: Payer: Self-pay | Admitting: Family Medicine

## 2016-12-05 ENCOUNTER — Ambulatory Visit (INDEPENDENT_AMBULATORY_CARE_PROVIDER_SITE_OTHER): Payer: BLUE CROSS/BLUE SHIELD | Admitting: Family Medicine

## 2016-12-05 VITALS — BP 115/71 | HR 71 | Temp 97.2°F | Ht 77.0 in | Wt 240.4 lb

## 2016-12-05 DIAGNOSIS — J0111 Acute recurrent frontal sinusitis: Secondary | ICD-10-CM

## 2016-12-05 MED ORDER — AMOXICILLIN-POT CLAVULANATE 875-125 MG PO TABS: 1.0000 | ORAL_TABLET | Freq: Two times a day (BID) | ORAL | 0 refills | Status: DC

## 2016-12-05 MED ORDER — PSEUDOEPHEDRINE-GUAIFENESIN ER 120-1200 MG PO TB12: 1.0000 | ORAL_TABLET | Freq: Two times a day (BID) | ORAL | 0 refills | Status: DC

## 2016-12-05 NOTE — Progress Notes (Signed)
Subjective:  Patient ID: Danny Snyder, male    DOB: 04/23/1966  Age: 50 y.o. MRN: [id number]  CC: Sinusitis (pt here today c/o sinus headache, coughing, sneezing, itching/watery eyes and runny nose.)   HPI Danny Snyder presents for Symptoms include congestion, facial pain, nasal congestion, no  fever, non productive cough, post nasal drip and sinus pressure with no fever, chills, night sweats or weight loss. Onset of symptoms was 5 days ago, gradually worsening since that time. No relief with Mucinex DM  History Danny Snyder has a past medical history of Anxiety.   He has no past surgical history on file.   His family history is not on file.He reports that he has never smoked. He has quit using smokeless tobacco. His smokeless tobacco use included Chew. He reports that he drinks alcohol. He reports that he does not use drugs.  Current Outpatient Prescriptions on File Prior to Visit  Medication Sig Dispense Refill  . aspirin 81 MG tablet Take 81 mg by mouth daily.    Marland Kitchen NIFEdipine (PROCARDIA-XL/ADALAT CC) 30 MG 24 hr tablet TAKE 1 TABLET (30 MG TOTAL) BY MOUTH DAILY. 90 tablet 0  . omeprazole (PRILOSEC) 40 MG capsule TAKE 1 CAPSULE (40 MG TOTAL) BY MOUTH DAILY. 30 capsule 5  . ST JOHNS WORT PO Take by mouth.     No current facility-administered medications on file prior to visit.     ROS Review of Systems  Constitutional: Negative for activity change, appetite change, chills and fever.  HENT: Positive for congestion, postnasal drip, rhinorrhea and sinus pressure. Negative for ear discharge, ear pain, hearing loss, nosebleeds, sneezing and trouble swallowing.   Respiratory: Negative for chest tightness and shortness of breath.   Cardiovascular: Negative for chest pain and palpitations.  Skin: Negative for rash.    Objective:  BP 115/71   Pulse 71   Temp 97.2 F (36.2 C) (Oral)   Ht 6\' 5"  (1.956 m)   Wt 240 lb 6 oz (109 kg)   BMI 28.50 kg/m   Physical Exam  Constitutional:  He appears well-developed and well-nourished.  HENT:  Head: Normocephalic and atraumatic.  Right Ear: Tympanic membrane and external ear normal. No decreased hearing is noted.  Left Ear: Tympanic membrane and external ear normal. No decreased hearing is noted.  Nose: Mucosal edema present. Right sinus exhibits frontal sinus tenderness. Right sinus exhibits no maxillary sinus tenderness. Left sinus exhibits no maxillary sinus tenderness and no frontal sinus tenderness.  Mouth/Throat: No oropharyngeal exudate or posterior oropharyngeal erythema.  Neck: No Brudzinski's sign noted.  Pulmonary/Chest: Breath sounds normal. No respiratory distress.  Lymphadenopathy:       Head (right side): No preauricular adenopathy present.       Head (left side): No preauricular adenopathy present.       Right cervical: No superficial cervical adenopathy present.      Left cervical: No superficial cervical adenopathy present.    Assessment & Plan:   Danny Snyder was seen today for sinusitis.  Diagnoses and all orders for this visit:  Acute recurrent frontal sinusitis  Other orders -     amoxicillin-clavulanate (AUGMENTIN) 875-125 MG tablet; Take 1 tablet by mouth 2 (two) times daily. Take all of this medication -     Pseudoephedrine-Guaifenesin [phone] MG TB12; Take 1 tablet by mouth 2 (two) times daily. For congestion   I have discontinued Danny Snyder's FIBER PO, Glucosamine-Chondroitin (OSTEO BI-FLEX REGULAR STRENGTH PO), lansoprazole, amoxicillin-clavulanate, Pseudoephedrine-Guaifenesin, and meloxicam. I am also having  him start on amoxicillin-clavulanate and Pseudoephedrine-Guaifenesin. Additionally, I am having him maintain his aspirin, ST JOHNS WORT PO, omeprazole, NIFEdipine, magnesium gluconate, and diclofenac.  Meds ordered this encounter  Medications  . magnesium gluconate (MAGONATE) 500 MG tablet    Sig: Take 500 mg by mouth 2 (two) times daily.  . diclofenac (VOLTAREN) 75 MG EC tablet    Sig:  Take 75 mg by mouth 2 (two) times daily.    Refill:  5  . amoxicillin-clavulanate (AUGMENTIN) 875-125 MG tablet    Sig: Take 1 tablet by mouth 2 (two) times daily. Take all of this medication    Dispense:  20 tablet    Refill:  0  . Pseudoephedrine-Guaifenesin [phone] MG TB12    Sig: Take 1 tablet by mouth 2 (two) times daily. For congestion    Dispense:  20 each    Refill:  0     Follow-up: No Follow-up on file.  Claretta Fraise, M.D.

## 2016-12-16 ENCOUNTER — Other Ambulatory Visit: Payer: Self-pay | Admitting: Family Medicine

## 2016-12-18 ENCOUNTER — Other Ambulatory Visit: Payer: Self-pay | Admitting: Family Medicine

## 2017-01-18 ENCOUNTER — Other Ambulatory Visit: Payer: Self-pay

## 2017-01-18 MED ORDER — OMEPRAZOLE 40 MG PO CPDR: DELAYED_RELEASE_CAPSULE | ORAL | 0 refills | Status: DC

## 2017-02-09 ENCOUNTER — Encounter: Payer: Self-pay | Admitting: Family Medicine

## 2017-02-09 ENCOUNTER — Ambulatory Visit (INDEPENDENT_AMBULATORY_CARE_PROVIDER_SITE_OTHER): Payer: BLUE CROSS/BLUE SHIELD | Admitting: Family Medicine

## 2017-02-09 ENCOUNTER — Telehealth: Payer: Self-pay | Admitting: Family Medicine

## 2017-02-09 VITALS — BP 124/81 | HR 82 | Temp 98.1°F | Ht 77.0 in | Wt 238.0 lb

## 2017-02-09 DIAGNOSIS — M8949 Other hypertrophic osteoarthropathy, multiple sites: Secondary | ICD-10-CM

## 2017-02-09 DIAGNOSIS — J3089 Other allergic rhinitis: Secondary | ICD-10-CM

## 2017-02-09 DIAGNOSIS — Z0000 Encounter for general adult medical examination without abnormal findings: Secondary | ICD-10-CM

## 2017-02-09 DIAGNOSIS — I7300 Raynaud's syndrome without gangrene: Secondary | ICD-10-CM

## 2017-02-09 DIAGNOSIS — M150 Primary generalized (osteo)arthritis: Secondary | ICD-10-CM

## 2017-02-09 DIAGNOSIS — M159 Polyosteoarthritis, unspecified: Secondary | ICD-10-CM

## 2017-02-09 LAB — URINALYSIS
BILIRUBIN UA: NEGATIVE
Glucose, UA: NEGATIVE
Ketones, UA: NEGATIVE
Leukocytes, UA: NEGATIVE
NITRITE UA: NEGATIVE
PH UA: 7 (ref 5.0–7.5)
Protein, UA: NEGATIVE
RBC UA: NEGATIVE
SPEC GRAV UA: 1.015 (ref 1.005–1.030)
UUROB: 0.2 mg/dL (ref 0.2–1.0)

## 2017-02-09 MED ORDER — FEXOFENADINE-PSEUDOEPHED ER 180-240 MG PO TB24: 1.0000 | ORAL_TABLET | Freq: Every day | ORAL | 11 refills | Status: DC

## 2017-02-09 NOTE — Telephone Encounter (Signed)
What type of referral do you need?Colonoscopy  Have you been seen at our office for this problem? Yes  (If no, schedule them an appointment.  They will need to be seen before a referral can be done.)  Is there a particular doctor or location that you prefer? [facility] in [place]  Patient notified that referrals can take up to a week or longer to process. If they haven't heard anything within a week they should call back and speak with the referral department.

## 2017-02-09 NOTE — Telephone Encounter (Signed)
Patient was seen today, wants to make sure you know he would like to be referred for colonoscopy.

## 2017-02-09 NOTE — Progress Notes (Signed)
Subjective:  Patient ID: Danny Snyder, male    DOB: [birth year]  Age: 51 y.o. MRN: [id number]  CC: Annual Exam (pt here today for CPE and has questions about his seasonal allergies.)   HPI Danny Snyder presents for Patient has allergic rhinitis symptoms including sneezing frequently sniffling, clear rhinorrhea, watery and itchy eyes. There has been no fever no chills no sweats. No earaches. There is some scratchy throat but no sore throat or difficulty swallowing. There is some nasal congestion. Patient in for annual physical. Seeing rheumatology for his arthritis and he notes. Both of those are under good control. The medications are necessary however as noted below. He is due for his colonoscopy.   History Danny Snyder has a past medical history of Anxiety; Arthritis; and GERD (gastroesophageal reflux disease).   He has no past surgical history on file.   His family history includes Arthritis in his brother and mother; Cancer in his father; Diabetes in his father.He reports that he has never smoked. He has quit using smokeless tobacco. His smokeless tobacco use included Chew. He reports that he drinks alcohol. He reports that he does not use drugs.    ROS Review of Systems  Constitutional: Negative for activity change, appetite change, chills, diaphoresis, fatigue, fever and unexpected weight change.  HENT: Positive for congestion, postnasal drip and rhinorrhea. Negative for ear pain, hearing loss, sore throat, tinnitus and trouble swallowing.   Eyes: Negative for photophobia, pain, discharge and redness.  Respiratory: Negative for apnea, cough, choking, chest tightness, shortness of breath, wheezing and stridor.   Cardiovascular: Negative for chest pain, palpitations and leg swelling.  Gastrointestinal: Negative for abdominal distention, abdominal pain, blood in stool, constipation, diarrhea, nausea and vomiting.  Endocrine: Negative for cold intolerance, heat intolerance, polydipsia,  polyphagia and polyuria.  Genitourinary: Negative for difficulty urinating, dysuria, enuresis, flank pain, frequency, genital sores, hematuria and urgency.  Musculoskeletal: Positive for arthralgias (hands, knees). Negative for joint swelling.  Skin: Negative for color change, rash and wound.  Allergic/Immunologic: Negative for immunocompromised state.  Neurological: Negative for dizziness, tremors, seizures, syncope, facial asymmetry, speech difficulty, weakness, light-headedness, numbness and headaches.  Hematological: Does not bruise/bleed easily.  Psychiatric/Behavioral: Negative for agitation, behavioral problems, confusion, decreased concentration, dysphoric mood, hallucinations, sleep disturbance and suicidal ideas. The patient is not nervous/anxious and is not hyperactive.     Objective:  BP 124/81   Pulse 82   Temp 98.1 F (36.7 C) (Oral)   Ht '6\' 5"'$  (1.956 m)   Wt 238 lb (108 kg)   BMI 28.22 kg/m   BP Readings from Last 3 Encounters:  02/09/17 124/81  12/05/16 115/71  12/23/15 (!) 148/88    Wt Readings from Last 3 Encounters:  02/09/17 238 lb (108 kg)  12/05/16 240 lb 6 oz (109 kg)  12/23/15 250 lb (113.4 kg)     Physical Exam  Constitutional: He is oriented to person, place, and time. He appears well-developed and well-nourished.  HENT:  Head: Normocephalic and atraumatic.  Mouth/Throat: Oropharynx is clear and moist.  Eyes: EOM are normal. Pupils are equal, round, and reactive to light.  Neck: Normal range of motion. No tracheal deviation present. No thyromegaly present.  Cardiovascular: Normal rate, regular rhythm and normal heart sounds.  Exam reveals no gallop and no friction rub.   No murmur heard. Pulmonary/Chest: Breath sounds normal. He has no wheezes. He has no rales.  Abdominal: Soft. He exhibits no mass. There is no tenderness.  Musculoskeletal: Normal range of  motion. He exhibits no edema.  Neurological: He is alert and oriented to person, place, and  time.  Skin: Skin is warm and dry.  Psychiatric: He has a normal mood and affect.    Ct Head Wo Contrast  Result Date: 06/16/2015 CLINICAL DATA:  Hit top of head 1 week ago with blunt object. Difficulty speaking and concentrating since then. EXAM: CT HEAD WITHOUT CONTRAST TECHNIQUE: Contiguous axial images were obtained from the base of the skull through the vertex without intravenous contrast. COMPARISON:  None. FINDINGS: There is no intracranial hemorrhage, mass or evidence of acute infarction. There is no extra-axial fluid collection. Gray matter and white matter appear normal. Cerebral volume is normal for age. Brainstem and posterior fossa are unremarkable. The CSF spaces appear normal. The bony structures are intact. The visible portions of the paranasal sinuses are clear. IMPRESSION: Normal brain Electronically Signed   By: Andreas Newport M.D.   On: 06/16/2015 01:37    Assessment & Plan:   Danny Snyder was seen today for annual exam.  Diagnoses and all orders for this visit:  Well adult exam -     CBC with Differential/Platelet -     CMP14+EGFR -     Lipid panel -     PSA Total (Reflex To Free) -     Urinalysis -     Ambulatory referral to Gastroenterology  Raynaud's disease without gangrene -     CBC with Differential/Platelet -     CMP14+EGFR -     Lipid panel -     PSA Total (Reflex To Free) -     Urinalysis  Chronic non-seasonal allergic rhinitis, unspecified trigger  Primary osteoarthritis involving multiple joints  Other orders -     fexofenadine-pseudoephedrine (ALLEGRA-D 24) 180-240 MG 24 hr tablet; Take 1 tablet by mouth daily.      I have discontinued [name]'s amoxicillin-clavulanate and Pseudoephedrine-Guaifenesin. I am also having him start on fexofenadine-pseudoephedrine. Additionally, I am having him maintain his aspirin, ST JOHNS WORT PO, magnesium gluconate, diclofenac, NIFEdipine, and omeprazole.  Allergies as of 02/09/2017      Reactions   Lodine  [etodolac] Other (See Comments)   Mouth sores       Medication List       Accurate as of 02/09/17 10:02 AM. Always use your most recent med list.          aspirin 81 MG tablet Take 81 mg by mouth daily.   diclofenac 75 MG EC tablet Commonly known as:  VOLTAREN Take 75 mg by mouth 2 (two) times daily.   fexofenadine-pseudoephedrine 180-240 MG 24 hr tablet Commonly known as:  ALLEGRA-D 24 Take 1 tablet by mouth daily.   magnesium gluconate 500 MG tablet Commonly known as:  MAGONATE Take 500 mg by mouth 2 (two) times daily.   NIFEdipine 30 MG 24 hr tablet Commonly known as:  PROCARDIA-XL/ADALAT-CC/NIFEDICAL-XL TAKE 1 TABLET (30 MG TOTAL) BY MOUTH DAILY.   omeprazole 40 MG capsule Commonly known as:  PRILOSEC TAKE 1 CAPSULE (40 MG TOTAL) BY MOUTH DAILY.   ST JOHNS WORT PO Take by mouth.        Follow-up: Return in about 1 year (around 02/09/2018).  Claretta Fraise, M.D.

## 2017-02-09 NOTE — Telephone Encounter (Signed)
The referral has already been made. Please let [facility] Kuster know [facility]

## 2017-02-09 NOTE — Telephone Encounter (Signed)
LMOM as per DPR  Referral placed and sent to [facility] and they will be contacting him for appointment date/time

## 2017-02-10 ENCOUNTER — Other Ambulatory Visit: Payer: Self-pay

## 2017-02-10 DIAGNOSIS — R7309 Other abnormal glucose: Secondary | ICD-10-CM

## 2017-02-10 LAB — CMP14+EGFR
A/G RATIO: 1.9 (ref 1.2–2.2)
ALK PHOS: 76 IU/L (ref 39–117)
ALT: 31 IU/L (ref 0–44)
AST: 19 IU/L (ref 0–40)
Albumin: 4.4 g/dL (ref 3.5–5.5)
BUN/Creatinine Ratio: 14 (ref 9–20)
BUN: 14 mg/dL (ref 6–24)
Bilirubin Total: 0.2 mg/dL (ref 0.0–1.2)
CO2: 24 mmol/L (ref 18–29)
Calcium: 9.5 mg/dL (ref 8.7–10.2)
Chloride: 104 mmol/L (ref 96–106)
Creatinine, Ser: 0.98 mg/dL (ref 0.76–1.27)
GFR calc Af Amer: 103 (ref >59)
GFR calc non Af Amer: 90 (ref >59)
GLOBULIN, TOTAL: 2.3 (ref 1.5–4.5)
Glucose: 111 mg/dL — ABNORMAL HIGH (ref 65–99)
Potassium: 4.6 mmol/L (ref 3.5–5.2)
Sodium: 143 mmol/L (ref 134–144)
Total Protein: 6.7 g/dL (ref 6.0–8.5)

## 2017-02-10 LAB — CBC WITH DIFFERENTIAL/PLATELET
BASOS: 0 %
Basophils Absolute: 0 10*3/uL (ref 0.0–0.2)
EOS (ABSOLUTE): 0.1 10*3/uL (ref 0.0–0.4)
EOS: 3 %
HEMATOCRIT: 42.6 % (ref 37.5–51.0)
Hemoglobin: 13.9 g/dL (ref 13.0–17.7)
Immature Grans (Abs): 0 10*3/uL (ref 0.0–0.1)
Immature Granulocytes: 0 %
Lymphocytes Absolute: 1.2 10*3/uL (ref 0.7–3.1)
Lymphs: 22 %
MCH: 28 pg (ref 26.6–33.0)
MCHC: 32.6 g/dL (ref 31.5–35.7)
MCV: 86 fL (ref 79–97)
MONOS ABS: 0.4 10*3/uL (ref 0.1–0.9)
Monocytes: 8 %
Neutrophils Absolute: 3.7 10*3/uL (ref 1.4–7.0)
Neutrophils: 67 %
Platelets: 211 10*3/uL (ref 150–379)
RBC: 4.96 x10E6/uL (ref 4.14–5.80)
RDW: 14.2 % (ref 12.3–15.4)
WBC: 5.5 10*3/uL (ref 3.4–10.8)

## 2017-02-10 LAB — LIPID PANEL
CHOLESTEROL TOTAL: 231 mg/dL — AB (ref 100–199)
Chol/HDL Ratio: 5 (ref 0.0–5.0)
HDL: 46 mg/dL (ref >39)
LDL CALC: 124 — AB (ref 0–99)
TRIGLYCERIDES: 305 mg/dL — AB (ref 0–149)
VLDL CHOLESTEROL CAL: 61 — AB (ref 5–40)

## 2017-02-10 LAB — PSA TOTAL (REFLEX TO FREE): Prostate Specific Ag, Serum: 0.9 ng/mL (ref 0.0–4.0)

## 2017-02-12 LAB — SPECIMEN STATUS REPORT

## 2017-02-12 LAB — HGB A1C W/O EAG: HEMOGLOBIN A1C: 5.5 % (ref 4.8–5.6)

## 2017-02-13 ENCOUNTER — Encounter: Payer: Self-pay | Admitting: Internal Medicine

## 2017-03-02 ENCOUNTER — Ambulatory Visit (AMBULATORY_SURGERY_CENTER): Payer: Self-pay

## 2017-03-02 VITALS — Ht 77.0 in | Wt 236.8 lb

## 2017-03-02 DIAGNOSIS — Z1211 Encounter for screening for malignant neoplasm of colon: Secondary | ICD-10-CM

## 2017-03-02 MED ORDER — NA SULFATE-K SULFATE-MG SULF 17.5-3.13-1.6 GM/177ML PO SOLN: 1.0000 | Freq: Once | ORAL | 0 refills | Status: AC

## 2017-03-02 NOTE — Progress Notes (Signed)
Denies allergies to eggs or soy products. Denies complication of anesthesia or sedation. Denies use of weight loss medication. Denies use of O2.   Emmi instructions given for colonoscopy.  

## 2017-03-03 ENCOUNTER — Encounter: Payer: Self-pay | Admitting: Internal Medicine

## 2017-03-06 ENCOUNTER — Telehealth: Payer: Self-pay | Admitting: Internal Medicine

## 2017-03-06 NOTE — Telephone Encounter (Signed)
Attempted to contact  patient for the second time. A pharmacy coupon was called in to [facility] in [place] in order for patient to receive prep for 50.00. Pharmacy states that they will contact the patient and inform him of the coupon. Coupon that was called with [facility] # [id number], PCN # [id number], ID # [id number] Group # [id number].   Riki Sheer, LPN

## 2017-03-06 NOTE — Telephone Encounter (Signed)
Attempted to call patient back. No answer. Left message to call back.   Riki Sheer, LPN

## 2017-03-15 ENCOUNTER — Ambulatory Visit (AMBULATORY_SURGERY_CENTER): Payer: BLUE CROSS/BLUE SHIELD | Admitting: Internal Medicine

## 2017-03-15 ENCOUNTER — Encounter: Payer: Self-pay | Admitting: Internal Medicine

## 2017-03-15 VITALS — BP 132/89 | HR 65 | Temp 98.4°F | Resp 18 | Ht 77.0 in | Wt 238.0 lb

## 2017-03-15 DIAGNOSIS — Z1212 Encounter for screening for malignant neoplasm of rectum: Secondary | ICD-10-CM

## 2017-03-15 DIAGNOSIS — D122 Benign neoplasm of ascending colon: Secondary | ICD-10-CM | POA: Diagnosis not present

## 2017-03-15 DIAGNOSIS — Z1211 Encounter for screening for malignant neoplasm of colon: Secondary | ICD-10-CM

## 2017-03-15 MED ORDER — SODIUM CHLORIDE 0.9 % IV SOLN: 500.0000 mL | INTRAVENOUS | Status: DC

## 2017-03-15 NOTE — Patient Instructions (Signed)
YOU HAD AN ENDOSCOPIC PROCEDURE TODAY AT THE [facility]:   Refer to the procedure report that was given to you for any specific questions about what was found during the examination.  If the procedure report does not answer your questions, please call your gastroenterologist to clarify.  If you requested that your care partner not be given the details of your procedure findings, then the procedure report has been included in a sealed envelope for you to review at your convenience later.  YOU SHOULD EXPECT: Some feelings of bloating in the abdomen. Passage of more gas than usual.  Walking can help get rid of the air that was put into your GI tract during the procedure and reduce the bloating. If you had a lower endoscopy (such as a colonoscopy or flexible sigmoidoscopy) you may notice spotting of blood in your stool or on the toilet paper. If you underwent a bowel prep for your procedure, you may not have a normal bowel movement for a few days.  Please Note:  You might notice some irritation and congestion in your nose or some drainage.  This is from the oxygen used during your procedure.  There is no need for concern and it should clear up in a day or so.  SYMPTOMS TO REPORT IMMEDIATELY:   Following lower endoscopy (colonoscopy or flexible sigmoidoscopy):  Excessive amounts of blood in the stool  Significant tenderness or worsening of abdominal pains  Swelling of the abdomen that is new, acute  Fever of 100F or higher   Following upper endoscopy (EGD)  Vomiting of blood or coffee ground material  New chest pain or pain under the shoulder blades  Painful or persistently difficult swallowing  New shortness of breath  Fever of 100F or higher  Black, tarry-looking stools  For urgent or emergent issues, a gastroenterologist can be reached at any hour by calling [phone].   DIET:  We do recommend a small meal at first, but then you may proceed to your regular diet.  Drink  plenty of fluids but you should avoid alcoholic beverages for 24 hours.  ACTIVITY:  You should plan to take it easy for the rest of today and you should NOT DRIVE or use heavy machinery until tomorrow (because of the sedation medicines used during the test).    FOLLOW UP: Our staff will call the number listed on your records the next business day following your procedure to check on you and address any questions or concerns that you may have regarding the information given to you following your procedure. If we do not reach you, we will leave a message.  However, if you are feeling well and you are not experiencing any problems, there is no need to return our call.  We will assume that you have returned to your regular daily activities without incident.  If any biopsies were taken you will be contacted by phone or by letter within the next 1-3 weeks.  Please call us at [phone] if you have not heard about the biopsies in 3 weeks.    SIGNATURES/CONFIDENTIALITY: You and/or your care partner have signed paperwork which will be entered into your electronic medical record.  These signatures attest to the fact that that the information above on your After Visit Summary has been reviewed and is understood.  Full responsibility of the confidentiality of this discharge information lies with you and/or your care-partner.  Polyp, diverticulosis, high fiber diet and hemorrhoid information given. 

## 2017-03-15 NOTE — Progress Notes (Signed)
Report to PACU, RN, vss, BBS= Clear.  

## 2017-03-15 NOTE — Progress Notes (Signed)
Pt's states no medical or surgical changes since previsit or office visit. 

## 2017-03-15 NOTE — Op Note (Signed)
[facility] Patient Name: Danny Snyder Procedure Date: 03/15/2017 2:04 PM MRN: [id number] Endoscopist: Jerene Bears , MD Age: 51 Referring MD:  Date of Birth: [birth year] Gender: Male Account #: [id number] Procedure:                Colonoscopy Indications:              Screening for colorectal malignant neoplasm, This                            is the patient's first colonoscopy Medicines:                Monitored Anesthesia Care Procedure:                Pre-Anesthesia Assessment:                           - Prior to the procedure, a History and Physical                            was performed, and patient medications and                            allergies were reviewed. The patient's tolerance of                            previous anesthesia was also reviewed. The risks                            and benefits of the procedure and the sedation                            options and risks were discussed with the patient.                            All questions were answered, and informed consent                            was obtained. Prior Anticoagulants: The patient has                            taken no previous anticoagulant or antiplatelet                            agents. ASA Grade Assessment: II - A patient with                            mild systemic disease. After reviewing the risks                            and benefits, the patient was deemed in                            satisfactory condition to undergo the procedure.  After obtaining informed consent, the colonoscope                            was passed under direct vision. Throughout the                            procedure, the patient's blood pressure, pulse, and                            oxygen saturations were monitored continuously. The                            Colonoscope was introduced through the anus and                            advanced to the the terminal  ileum. The colonoscopy                            was performed without difficulty. The patient                            tolerated the procedure well. The quality of the                            bowel preparation was good. The ileocecal valve,                            appendiceal orifice, and rectum were photographed. Scope In: 2:14:22 PM Scope Out: 2:32:16 PM Scope Withdrawal Time: 0 hours 15 minutes 23 seconds  Total Procedure Duration: 0 hours 17 minutes 54 seconds  Findings:                 The digital rectal exam was normal.                           The terminal ileum appeared normal.                           A 10 mm polyp was found in the ascending colon. The                            polyp was sessile. The polyp was removed with a                            cold snare. Resection and retrieval were complete.                           Multiple small-mouthed diverticula were found in                            the sigmoid colon.                           Internal hemorrhoids were found during  retroflexion. The hemorrhoids were small. Complications:            No immediate complications. Estimated Blood Loss:     Estimated blood loss was minimal. Impression:               - The examined portion of the ileum was normal.                           - One 10 mm polyp in the ascending colon, removed                            with a cold snare. Resected and retrieved.                           - Mild diverticulosis in the sigmoid colon.                           - Internal hemorrhoids. Recommendation:           - Patient has a contact number available for                            emergencies. The signs and symptoms of potential                            delayed complications were discussed with the                            patient. Return to normal activities tomorrow.                            Written discharge instructions were provided to the                             patient.                           - Resume previous diet.                           - Continue present medications.                           - Await pathology results.                           - Repeat colonoscopy is recommended. The                            colonoscopy date will be determined after pathology                            results from today's exam become available for                            review. Jerene Bears, MD 03/15/2017 2:35:24 PM This report has been signed  electronically.

## 2017-03-15 NOTE — Progress Notes (Signed)
Called to room to assist during endoscopic procedure.  Patient ID and intended procedure confirmed with present staff. Received instructions for my participation in the procedure from the performing physician.  

## 2017-03-16 ENCOUNTER — Telehealth: Payer: Self-pay | Admitting: *Deleted

## 2017-03-16 NOTE — Telephone Encounter (Signed)
  Follow up Call-  Call back number 03/15/2017  Post procedure Call Back phone  # [phone]  Permission to leave phone message Yes  Some recent data might be hidden     Patient questions:  Do you have a fever, pain , or abdominal swelling? No. Pain Score  0 *  Have you tolerated food without any problems? Yes.    Have you been able to return to your normal activities? Yes.    Do you have any questions about your discharge instructions: Diet   No. Medications  No. Follow up visit  No.  Do you have questions or concerns about your Care? No.  Actions: * If pain score is 4 or above: No action needed, pain <4.

## 2017-03-21 ENCOUNTER — Encounter: Payer: Self-pay | Admitting: Internal Medicine

## 2017-05-29 ENCOUNTER — Other Ambulatory Visit: Payer: Self-pay | Admitting: Family Medicine

## 2017-06-07 DIAGNOSIS — M2550 Pain in unspecified joint: Secondary | ICD-10-CM | POA: Diagnosis not present

## 2017-06-07 DIAGNOSIS — M150 Primary generalized (osteo)arthritis: Secondary | ICD-10-CM | POA: Diagnosis not present

## 2017-06-07 DIAGNOSIS — I7300 Raynaud's syndrome without gangrene: Secondary | ICD-10-CM | POA: Diagnosis not present

## 2017-08-19 ENCOUNTER — Telehealth: Payer: BLUE CROSS/BLUE SHIELD | Admitting: Family

## 2017-08-19 DIAGNOSIS — B9689 Other specified bacterial agents as the cause of diseases classified elsewhere: Secondary | ICD-10-CM

## 2017-08-19 DIAGNOSIS — J329 Chronic sinusitis, unspecified: Secondary | ICD-10-CM

## 2017-08-19 MED ORDER — AMOXICILLIN-POT CLAVULANATE 875-125 MG PO TABS: 1.0000 | ORAL_TABLET | Freq: Two times a day (BID) | ORAL | 0 refills | Status: AC

## 2017-08-19 NOTE — Progress Notes (Signed)
Thank you for the details you put in the comment boxes. Those details really help [place] take better care of you.   We are sorry that you are not feeling well.  Here is how we plan to help!  Based on what you have shared with me it looks like you have sinusitis.  Sinusitis is inflammation and infection in the sinus cavities of the head.  Based on your presentation I believe you most likely have Acute Bacterial Sinusitis.  This is an infection caused by bacteria and is treated with antibiotics. I have prescribed Augmentin 875mg /125mg  one tablet twice daily with food, for 7 days. You may use an oral decongestant such as Mucinex D or if you have glaucoma or high blood pressure use plain Mucinex. Saline nasal spray help and can safely be used as often as needed for congestion.  If you develop worsening sinus pain, fever or notice severe headache and vision changes, or if symptoms are not better after completion of antibiotic, please schedule an appointment with a health care provider.    Sinus infections are not as easily transmitted as other respiratory infection, however we still recommend that you avoid close contact with loved ones, especially the very young and elderly.  Remember to wash your hands thoroughly throughout the day as this is the number one way to prevent the spread of infection!  Home Care:  Only take medications as instructed by your medical team.  Complete the entire course of an antibiotic.  Do not take these medications with alcohol.  A steam or ultrasonic humidifier can help congestion.  You can place a towel over your head and breathe in the steam from hot water coming from a faucet.  Avoid close contacts especially the very young and the elderly.  Cover your mouth when you cough or sneeze.  Always remember to wash your hands.  Get Help Right Away If:  You develop worsening fever or sinus pain.  You develop a severe head ache or visual changes.  Your symptoms persist  after you have completed your treatment plan.  Make sure you  Understand these instructions.  Will watch your condition.  Will get help right away if you are not doing well or get worse.  Your e-visit answers were reviewed by a board certified advanced clinical practitioner to complete your personal care plan.  Depending on the condition, your plan could have included both over the counter or prescription medications.  If there is a problem please reply  once you have received a response from your provider.  Your safety is important to [place].  If you have drug allergies check your prescription carefully.    You can use MyChart to ask questions about today's visit, request a non-urgent call back, or ask for a work or school excuse for 24 hours related to this e-Visit. If it has been greater than 24 hours you will need to follow up with your provider, or enter a new e-Visit to address those concerns.  You will get an e-mail in the next two days asking about your experience.  I hope that your e-visit has been valuable and will speed your recovery. Thank you for using e-visits.

## 2017-08-25 ENCOUNTER — Other Ambulatory Visit: Payer: Self-pay | Admitting: Family Medicine

## 2017-08-25 NOTE — Telephone Encounter (Signed)
Last seen 02/09/17  Dr Livia Snellen

## 2017-11-21 ENCOUNTER — Other Ambulatory Visit: Payer: Self-pay | Admitting: *Deleted

## 2017-11-21 MED ORDER — NIFEDIPINE ER 30 MG PO TB24: ORAL_TABLET | ORAL | 0 refills | Status: DC

## 2017-11-21 MED ORDER — OMEPRAZOLE 40 MG PO CPDR: DELAYED_RELEASE_CAPSULE | ORAL | 0 refills | Status: DC

## 2017-11-21 NOTE — Telephone Encounter (Signed)
02/09/17 OV pt to return in 1 yr

## 2017-11-21 NOTE — Addendum Note (Signed)
Addended by: Antonietta Barcelona D on: 11/21/2017 12:31 PM   Modules accepted: Orders

## 2018-02-14 ENCOUNTER — Ambulatory Visit (INDEPENDENT_AMBULATORY_CARE_PROVIDER_SITE_OTHER): Payer: BLUE CROSS/BLUE SHIELD | Admitting: Family Medicine

## 2018-02-14 ENCOUNTER — Encounter: Payer: Self-pay | Admitting: Family Medicine

## 2018-02-14 VITALS — BP 123/69 | HR 81 | Temp 97.7°F | Ht 77.0 in | Wt 236.0 lb

## 2018-02-14 DIAGNOSIS — I7300 Raynaud's syndrome without gangrene: Secondary | ICD-10-CM

## 2018-02-14 DIAGNOSIS — Z0000 Encounter for general adult medical examination without abnormal findings: Secondary | ICD-10-CM | POA: Diagnosis not present

## 2018-02-14 DIAGNOSIS — Z23 Encounter for immunization: Secondary | ICD-10-CM

## 2018-02-14 DIAGNOSIS — I10 Essential (primary) hypertension: Secondary | ICD-10-CM

## 2018-02-14 DIAGNOSIS — N401 Enlarged prostate with lower urinary tract symptoms: Secondary | ICD-10-CM

## 2018-02-14 DIAGNOSIS — R350 Frequency of micturition: Secondary | ICD-10-CM

## 2018-02-14 LAB — URINALYSIS
BILIRUBIN UA: NEGATIVE
GLUCOSE, UA: NEGATIVE
Ketones, UA: NEGATIVE
Leukocytes, UA: NEGATIVE
Nitrite, UA: NEGATIVE
PROTEIN UA: NEGATIVE
RBC, UA: NEGATIVE
Specific Gravity, UA: 1.015 (ref 1.005–1.030)
UUROB: 0.2 mg/dL (ref 0.2–1.0)
pH, UA: 8 — ABNORMAL HIGH (ref 5.0–7.5)

## 2018-02-14 MED ORDER — TAMSULOSIN HCL 0.4 MG PO CAPS: 0.8000 mg | ORAL_CAPSULE | Freq: Every day | ORAL | 3 refills | Status: DC

## 2018-02-14 MED ORDER — OMEPRAZOLE 40 MG PO CPDR: DELAYED_RELEASE_CAPSULE | ORAL | 3 refills | Status: DC

## 2018-02-14 MED ORDER — NIFEDIPINE ER 30 MG PO TB24: ORAL_TABLET | ORAL | 3 refills | Status: DC

## 2018-02-14 NOTE — Progress Notes (Signed)
Subjective:  Patient ID: Danny Snyder, male    DOB: 02/22/1966  Age: 52 y.o. MRN: [id number]  CC: Annual Exam (pt here today for CPE and also c/o what he believes is the beginning of a sinus infection and increased urination at night)   HPI Danny Snyder presents for annual physical exam as well as concerns for frequency of urination.  Patient tells me that he has urinary frequency through the day.  [place]hour trip he will have to stop to urinate 3 or 4 times.  Additionally he is up during the night 2-3 times and has suffered from lower quality sleep.  He does not feel well rested.  Symptoms have started about a year ago and reports progressed.  He says he is always had some instances of having to urinate frequently ever since he was a little boy.  He has not experienced any burning with urination he is not sexually active with multiple partners.  Sex life is normal and unremarkable.   He also has some congestion.  He uses Flonase for this.  He says that the [place] did not seem to help.  He discontinued that several months ago.  Congestion has increased over the last few days.  He is concerned for a sinus infection.  However, he is not having purulent rhinorrhea or sinus pressure.  There has been no cough fever chills or sweats he is not short of breath.  He does have chronic allergic rhinitis for which she uses Flonase.   Follow-up of hypertension. Patient has no history of headache chest pain or shortness of breath or recent cough. Patient also denies symptoms of TIA such as numbness weakness lateralizing. Patient checks  blood pressure at home and has not had any elevated readings recently. Patient denies side effects from his medication. States taking it regularly. Colonoscopy performed March 2018.  Depression screen [facility] 2/9 02/14/2018 02/09/2017 12/05/2016  Decreased Interest 0 0 0  Down, Depressed, Hopeless 0 0 0  PHQ - 2 Score 0 0 0     History Diar has a past medical history of Allergy, Anxiety, Arthritis, GERD (gastroesophageal reflux disease), and Raynaud's disease.   He has no past surgical history on file.   His family history includes Arthritis in his brother and mother; Cancer in his father; Diabetes in his father.He reports that he quit smoking about 16 years ago. He has quit using smokeless tobacco. His smokeless tobacco use included chew. He reports that he drinks alcohol. He reports that he does not use drugs.    ROS Review of Systems  Constitutional: Negative for activity change, appetite change, chills, diaphoresis, fatigue, fever and unexpected weight change.  HENT: Negative for congestion, ear pain, hearing loss, postnasal drip, rhinorrhea, sore throat, tinnitus and trouble swallowing.   Eyes: Negative for photophobia, pain, discharge and redness.  Respiratory: Negative for apnea, cough, choking, chest tightness, shortness of breath, wheezing and stridor.   Cardiovascular: Negative for chest pain, palpitations and leg swelling.  Gastrointestinal: Negative for abdominal distention, abdominal pain, blood in stool, constipation, diarrhea, nausea and vomiting.  Endocrine: Negative for cold intolerance, heat intolerance, polydipsia, polyphagia and polyuria.  Genitourinary: Positive for difficulty urinating and frequency. Negative for decreased urine volume, dysuria, enuresis, flank pain, genital sores, hematuria and urgency.  Musculoskeletal: Negative for arthralgias and joint swelling.  Skin: Negative for color change, rash and wound.  Allergic/Immunologic: Negative for immunocompromised state.  Neurological: Negative for dizziness,  tremors, seizures, syncope, facial asymmetry, speech difficulty, weakness, light-headedness, numbness and headaches.  Hematological: Does not bruise/bleed easily.  Psychiatric/Behavioral: Negative for agitation, behavioral problems, confusion, decreased concentration, dysphoric  mood, hallucinations, sleep disturbance and suicidal ideas. The patient is not nervous/anxious and is not hyperactive.     Objective:  BP 123/69   Pulse 81   Temp 97.7 F (36.5 C) (Oral)   Ht '6\' 5"'$  (1.956 m)   Wt 236 lb (107 kg)   BMI 27.99 kg/m   BP Readings from Last 3 Encounters:  02/14/18 123/69  03/15/17 132/89  02/09/17 124/81    Wt Readings from Last 3 Encounters:  02/14/18 236 lb (107 kg)  03/15/17 238 lb (108 kg)  03/02/17 236 lb 12.8 oz (107.4 kg)     Physical Exam  Constitutional: He is oriented to person, place, and time. He appears well-developed and well-nourished.  HENT:  Head: Normocephalic and atraumatic.  Mouth/Throat: Oropharynx is clear and moist.  Eyes: EOM are normal. Pupils are equal, round, and reactive to light.  Neck: Normal range of motion. No tracheal deviation present. No thyromegaly present.  Cardiovascular: Normal rate, regular rhythm and normal heart sounds. Exam reveals no gallop and no friction rub.  No murmur heard. Pulmonary/Chest: Breath sounds normal. He has no wheezes. He has no rales.  Abdominal: Soft. He exhibits no mass. There is no tenderness.  Musculoskeletal: Normal range of motion. He exhibits no edema.  Neurological: He is alert and oriented to person, place, and time.  Skin: Skin is warm and dry.  Psychiatric: He has a normal mood and affect.      Assessment & Plan:   Shunsuke was seen today for annual exam.  Diagnoses and all orders for this visit:  Well adult exam -     CBC with Differential/Platelet -     CMP14+EGFR -     Lipid panel -     PSA, total and free -     VITAMIN D 25 Hydroxy (Vit-D Deficiency, Fractures) -     Urinalysis  Other orders -     Tdap vaccine greater than or equal to 7yo IM -     tamsulosin (FLOMAX) 0.4 MG CAPS capsule; Take 2 capsules (0.8 mg total) by mouth daily. -     NIFEdipine (PROCARDIA-XL/ADALAT CC) 30 MG 24 hr tablet; TAKE 1 TABLET (30 MG TOTAL) BY MOUTH DAILY. -      omeprazole (PRILOSEC) 40 MG capsule; TAKE 1 CAPSULE (40 MG TOTAL) BY MOUTH DAILY.       I have discontinued [place] fexofenadine-pseudoephedrine. I am also having him start on tamsulosin. Additionally, I am having him maintain his aspirin, [place] JOH[place] WORT PO, magnesium gluconate, diclofenac, NIFEdipine, and omeprazole. We will continue to administer sodium chloride.  Allergies as of 02/14/2018      Reactions   Lodine [etodolac] Other (See Comments)   Mouth sores       Medication List        Accurate as of 02/14/18 12:01 PM. Always use your most recent med list.          aspirin 81 MG tablet Take 81 mg by mouth daily.   diclofenac 75 MG EC tablet Commonly known as:  VOLTAREN Take 75 mg by mouth 2 (two) times daily.   magnesium gluconate 500 MG tablet Commonly known as:  MAGONATE Take 500 mg by mouth 2 (two) times daily.   NIFEdipine 30 MG 24 hr tablet Commonly known as:  PROCARDIA-XL/ADALAT CC TAKE 1 TABLET (30 MG TOTAL) BY MOUTH DAILY.   omeprazole 40 MG capsule Commonly known as:  PRILOSEC TAKE 1 CAPSULE (40 MG TOTAL) BY MOUTH DAILY.   ST JOHNS WORT PO Take by mouth.   tamsulosin 0.4 MG Caps capsule Commonly known as:  FLOMAX Take 2 capsules (0.8 mg total) by mouth daily.        Follow-up: Return in about 6 months (around 08/14/2018) for hypertension, BPH.  Claretta Fraise, M.D.

## 2018-02-15 LAB — CBC WITH DIFFERENTIAL/PLATELET
BASOS: 0 %
Basophils Absolute: 0 10*3/uL (ref 0.0–0.2)
EOS (ABSOLUTE): 0.3 10*3/uL (ref 0.0–0.4)
Eos: 3 %
Hematocrit: 42.3 % (ref 37.5–51.0)
Hemoglobin: 14.3 g/dL (ref 13.0–17.7)
IMMATURE GRANULOCYTES: 0 %
Immature Grans (Abs): 0 10*3/uL (ref 0.0–0.1)
LYMPHS ABS: 1.5 10*3/uL (ref 0.7–3.1)
Lymphs: 18 %
MCH: 28.9 pg (ref 26.6–33.0)
MCHC: 33.8 g/dL (ref 31.5–35.7)
MCV: 86 fL (ref 79–97)
MONOS ABS: 0.8 10*3/uL (ref 0.1–0.9)
Monocytes: 10 %
NEUTROS ABS: 5.8 10*3/uL (ref 1.4–7.0)
NEUTROS PCT: 69 %
PLATELETS: 243 10*3/uL (ref 150–379)
RBC: 4.95 x10E6/uL (ref 4.14–5.80)
RDW: 14.3 % (ref 12.3–15.4)
WBC: 8.4 10*3/uL (ref 3.4–10.8)

## 2018-02-15 LAB — CMP14+EGFR
ALT: 36 IU/L (ref 0–44)
AST: 25 IU/L (ref 0–40)
Albumin/Globulin Ratio: 1.9 (ref 1.2–2.2)
Albumin: 4.6 g/dL (ref 3.5–5.5)
Alkaline Phosphatase: 80 IU/L (ref 39–117)
BUN/Creatinine Ratio: 13 (ref 9–20)
BUN: 12 mg/dL (ref 6–24)
Bilirubin Total: 0.2 mg/dL (ref 0.0–1.2)
CO2: 27 mmol/L (ref 20–29)
Calcium: 9.7 mg/dL (ref 8.7–10.2)
Chloride: 103 mmol/L (ref 96–106)
Creatinine, Ser: 0.92 mg/dL (ref 0.76–1.27)
GFR calc Af Amer: 111 mL/min/{1.73_m2} (ref >59)
GFR calc non Af Amer: 96 mL/min/{1.73_m2} (ref >59)
Globulin, Total: 2.4 g/dL (ref 1.5–4.5)
Glucose: 79 mg/dL (ref 65–99)
Potassium: 4.1 mmol/L (ref 3.5–5.2)
Sodium: 143 mmol/L (ref 134–144)
Total Protein: 7 g/dL (ref 6.0–8.5)

## 2018-02-15 LAB — LIPID PANEL
Chol/HDL Ratio: 5.5 ratio — ABNORMAL HIGH (ref 0.0–5.0)
Cholesterol, Total: 225 mg/dL — ABNORMAL HIGH (ref 100–199)
HDL: 41 mg/dL (ref >39)
LDL Calculated: 118 mg/dL — ABNORMAL HIGH (ref 0–99)
TRIGLYCERIDES: 331 mg/dL — AB (ref 0–149)
VLDL Cholesterol Cal: 66 mg/dL — ABNORMAL HIGH (ref 5–40)

## 2018-02-15 LAB — PSA, TOTAL AND FREE
PSA FREE PCT: 45.6 %
PSA FREE: 0.41 ng/mL
Prostate Specific Ag, Serum: 0.9 ng/mL (ref 0.0–4.0)

## 2018-02-15 LAB — VITAMIN D 25 HYDROXY (VIT D DEFICIENCY, FRACTURES): Vit D, 25-Hydroxy: 19.7 ng/mL — ABNORMAL LOW (ref 30.0–100.0)

## 2018-02-16 ENCOUNTER — Encounter: Payer: Self-pay | Admitting: Family Medicine

## 2018-02-16 ENCOUNTER — Other Ambulatory Visit: Payer: Self-pay | Admitting: Family Medicine

## 2018-02-16 MED ORDER — VITAMIN D (ERGOCALCIFEROL) 1.25 MG (50000 UNIT) PO CAPS: 50000.0000 [IU] | ORAL_CAPSULE | ORAL | 3 refills | Status: DC

## 2018-02-20 ENCOUNTER — Encounter: Payer: Self-pay | Admitting: *Deleted

## 2018-06-12 DIAGNOSIS — I7300 Raynaud's syndrome without gangrene: Secondary | ICD-10-CM | POA: Diagnosis not present

## 2018-06-12 DIAGNOSIS — M150 Primary generalized (osteo)arthritis: Secondary | ICD-10-CM | POA: Diagnosis not present

## 2018-06-12 DIAGNOSIS — M2550 Pain in unspecified joint: Secondary | ICD-10-CM | POA: Diagnosis not present

## 2018-08-14 ENCOUNTER — Ambulatory Visit: Payer: BLUE CROSS/BLUE SHIELD | Admitting: Family Medicine

## 2018-08-29 ENCOUNTER — Encounter: Payer: Self-pay | Admitting: Family Medicine

## 2018-08-29 ENCOUNTER — Other Ambulatory Visit: Payer: Self-pay | Admitting: Family Medicine

## 2018-08-29 ENCOUNTER — Ambulatory Visit: Payer: BLUE CROSS/BLUE SHIELD | Admitting: Family Medicine

## 2018-08-29 ENCOUNTER — Telehealth: Payer: Self-pay | Admitting: *Deleted

## 2018-08-29 VITALS — BP 137/76 | HR 80 | Temp 97.1°F | Ht 77.0 in | Wt 224.5 lb

## 2018-08-29 DIAGNOSIS — M159 Polyosteoarthritis, unspecified: Secondary | ICD-10-CM

## 2018-08-29 DIAGNOSIS — M150 Primary generalized (osteo)arthritis: Secondary | ICD-10-CM | POA: Insufficient documentation

## 2018-08-29 DIAGNOSIS — N3289 Other specified disorders of bladder: Secondary | ICD-10-CM | POA: Diagnosis not present

## 2018-08-29 DIAGNOSIS — I7300 Raynaud's syndrome without gangrene: Secondary | ICD-10-CM

## 2018-08-29 DIAGNOSIS — M8949 Other hypertrophic osteoarthropathy, multiple sites: Secondary | ICD-10-CM | POA: Insufficient documentation

## 2018-08-29 LAB — CMP14+EGFR
ALT: 36 IU/L (ref 0–44)
AST: 28 IU/L (ref 0–40)
Albumin/Globulin Ratio: 2.3 — ABNORMAL HIGH (ref 1.2–2.2)
Albumin: 4.5 g/dL (ref 3.5–5.5)
Alkaline Phosphatase: 73 IU/L (ref 39–117)
BUN/Creatinine Ratio: 13 (ref 9–20)
BUN: 13 mg/dL (ref 6–24)
Bilirubin Total: <0.2 mg/dL (ref 0.0–1.2)
CO2: 23 mmol/L (ref 20–29)
CREATININE: 1.01 mg/dL (ref 0.76–1.27)
Calcium: 9.6 mg/dL (ref 8.7–10.2)
Chloride: 104 mmol/L (ref 96–106)
GFR calc Af Amer: 99 mL/min/{1.73_m2} (ref >59)
GFR calc non Af Amer: 86 mL/min/{1.73_m2} (ref >59)
GLUCOSE: 102 mg/dL — AB (ref 65–99)
Globulin, Total: 2 g/dL (ref 1.5–4.5)
Potassium: 4.3 mmol/L (ref 3.5–5.2)
Sodium: 141 mmol/L (ref 134–144)
Total Protein: 6.5 g/dL (ref 6.0–8.5)

## 2018-08-29 LAB — CBC WITH DIFFERENTIAL/PLATELET
BASOS: 0 %
Basophils Absolute: 0 10*3/uL (ref 0.0–0.2)
EOS (ABSOLUTE): 0.2 10*3/uL (ref 0.0–0.4)
EOS: 3 %
Hematocrit: 42.4 % (ref 37.5–51.0)
Hemoglobin: 13.9 g/dL (ref 13.0–17.7)
Immature Grans (Abs): 0 10*3/uL (ref 0.0–0.1)
Immature Granulocytes: 0 %
Lymphocytes Absolute: 1.2 10*3/uL (ref 0.7–3.1)
Lymphs: 21 %
MCH: 28 pg (ref 26.6–33.0)
MCHC: 32.8 g/dL (ref 31.5–35.7)
MCV: 86 fL (ref 79–97)
Monocytes Absolute: 0.5 10*3/uL (ref 0.1–0.9)
Monocytes: 10 %
Neutrophils Absolute: 3.7 10*3/uL (ref 1.4–7.0)
Neutrophils: 66 %
PLATELETS: 228 10*3/uL (ref 150–450)
RBC: 4.96 x10E6/uL (ref 4.14–5.80)
RDW: 13.1 % (ref 12.3–15.4)
WBC: 5.6 10*3/uL (ref 3.4–10.8)

## 2018-08-29 LAB — URINALYSIS
BILIRUBIN UA: NEGATIVE
Glucose, UA: NEGATIVE
Ketones, UA: NEGATIVE
Leukocytes, UA: NEGATIVE
NITRITE UA: NEGATIVE
PH UA: 7 (ref 5.0–7.5)
PROTEIN UA: NEGATIVE
RBC UA: NEGATIVE
SPEC GRAV UA: 1.02 (ref 1.005–1.030)
UUROB: 0.2 mg/dL (ref 0.2–1.0)

## 2018-08-29 MED ORDER — TAMSULOSIN HCL 0.4 MG PO CAPS: 0.8000 mg | ORAL_CAPSULE | Freq: Every day | ORAL | 3 refills | Status: DC

## 2018-08-29 MED ORDER — SOLIFENACIN SUCCINATE 10 MG PO TABS: 10.0000 mg | ORAL_TABLET | Freq: Every day | ORAL | 2 refills | Status: DC

## 2018-08-29 MED ORDER — MIRABEGRON ER 50 MG PO TB24: 50.0000 mg | ORAL_TABLET | Freq: Every day | ORAL | 1 refills | Status: DC

## 2018-08-29 NOTE — Patient Instructions (Signed)

## 2018-08-29 NOTE — Progress Notes (Signed)
Subjective:  Patient ID: Danny REIERSON, male    DOB: Sep 27, 1966  Age: 52 y.o. MRN: [id number]  CC: Medical Management of Chronic Issues   HPI Danny Snyder presents for recheck of his [place] disease.  He continues to take take nifedipine.  He says it works extremely well for him.  He is only had a couple of minor occurrences back in the winter.  Medication does not cause any side effects.  Today his primary concern relates to urinary frequency.  He says he was diagnosed in his youth with bladder spasms.  Recently in spite of using tamsulosin his nocturia has increased to 4-5 times per night.  He discontinued the tamsulosin and did not notice any worsening.  Depression screen [facility] 2/9 08/29/2018 02/14/2018 02/09/2017  Decreased Interest 0 0 0  Down, Depressed, Hopeless 0 0 0  PHQ - 2 Score 0 0 0    History Danny Snyder has a past medical history of Allergy, Anxiety, Arthritis, GERD (gastroesophageal reflux disease), and Raynaud's disease.   He has no past surgical history on file.   His family history includes Arthritis in his brother and mother; Cancer in his father; Diabetes in his father.He reports that he quit smoking about 16 years ago. He has quit using smokeless tobacco.  His smokeless tobacco use included chew. He reports that he drinks alcohol. He reports that he does not use drugs.    ROS Review of Systems  Constitutional: Negative.   HENT: Negative.   Eyes: Negative for visual disturbance.  Respiratory: Negative for cough and shortness of breath.   Cardiovascular: Negative for chest pain and leg swelling.  Gastrointestinal: Negative for abdominal pain, diarrhea, nausea and vomiting.  Genitourinary: Positive for difficulty urinating, frequency and urgency. Negative for discharge, dysuria, enuresis, flank pain, penile pain and penile swelling.  Skin: Negative for rash.  Neurological: Negative for headaches.  Psychiatric/Behavioral: Negative for sleep disturbance.     Objective:  BP 137/76   Pulse 80   Temp (!) 97.1 F (36.2 C) (Oral)   Ht '6\' 5"'$  (1.956 m)   Wt 224 lb 8 oz (101.8 kg)   BMI 26.62 kg/m   BP Readings from Last 3 Encounters:  08/29/18 137/76  02/14/18 123/69  03/15/17 132/89    Wt Readings from Last 3 Encounters:  08/29/18 224 lb 8 oz (101.8 kg)  02/14/18 236 lb (107 kg)  03/15/17 238 lb (108 kg)     Physical Exam  Constitutional: He is oriented to person, place, and time. He appears well-developed and well-nourished. No distress.  HENT:  Head: Normocephalic and atraumatic.  Right Ear: External ear normal.  Left Ear: External ear normal.  Nose: Nose normal.  Mouth/Throat: Oropharynx is clear and moist.  Eyes: Pupils are equal, round, and reactive to light. Conjunctivae and EOM are normal.  Neck: Normal range of motion. Neck supple.  Cardiovascular: Normal rate, regular rhythm and normal heart sounds.  No murmur heard. Pulmonary/Chest: [place] normal and breath sounds normal. No respiratory distress. He has no wheezes. He has no rales.  Abdominal: Soft. There is no tenderness.  Musculoskeletal: Normal range of motion.  Neurological: He is alert and oriented to person, place, and time. He has normal reflexes.  Skin: Skin is warm and dry.  Psychiatric: He has a normal mood and affect. His behavior is normal. Judgment and thought content normal.      Assessment & Plan:   Danny Snyder was seen today for medical management of chronic issues.  Diagnoses and all orders for this visit:  Bladder spasms -     CBC with Differential/Platelet -     CMP14+EGFR -     Urine Culture -     Urinalysis  Raynaud's disease without gangrene  Primary osteoarthritis involving multiple joints  Other orders -     mirabegron ER (MYRBETRIQ) 50 MG TB24 tablet; Take 1 tablet (50 mg total) by mouth daily. For bladder spasms -     tamsulosin (FLOMAX) 0.4 MG CAPS capsule; Take 2 capsules (0.8 mg total) by mouth at bedtime.       I have  discontinued Danny Snyder's [place] WORT PO. I have also changed his tamsulosin. Additionally, I am having him start on mirabegron ER. Lastly, I am having him maintain his aspirin, magnesium gluconate, diclofenac, NIFEdipine, omeprazole, Vitamin D (Ergocalciferol), and fluticasone. We will continue to administer sodium chloride.  Allergies as of 08/29/2018      Reactions   Lodine [etodolac] Other (See Comments)   Mouth sores       Medication List        Accurate as of 08/29/18  1:02 PM. Always use your most recent med list.          aspirin 81 MG tablet Take 81 mg by mouth daily.   diclofenac 75 MG EC tablet Commonly known as:  VOLTAREN Take 75 mg by mouth 2 (two) times daily.   fluticasone 50 MCG/ACT nasal spray Commonly known as:  FLONASE Place 1 spray into both nostrils daily as needed for allergies or rhinitis.   magnesium gluconate 500 MG tablet Commonly known as:  MAGONATE Take 500 mg by mouth 2 (two) times daily.   mirabegron ER 50 MG Tb24 tablet Commonly known as:  MYRBETRIQ Take 1 tablet (50 mg total) by mouth daily. For bladder spasms   NIFEdipine 30 MG 24 hr tablet Commonly known as:  PROCARDIA-XL/ADALAT CC TAKE 1 TABLET (30 MG TOTAL) BY MOUTH DAILY.   omeprazole 40 MG capsule Commonly known as:  PRILOSEC TAKE 1 CAPSULE (40 MG TOTAL) BY MOUTH DAILY.   tamsulosin 0.4 MG Caps capsule Commonly known as:  FLOMAX Take 2 capsules (0.8 mg total) by mouth at bedtime.   Vitamin D (Ergocalciferol) 50000 units Caps capsule Commonly known as:  DRISDOL Take 1 capsule (50,000 Units total) by mouth every 7 (seven) days.        Follow-up: Return in about 6 weeks (around 10/10/2018).  Claretta Fraise, M.D.

## 2018-08-29 NOTE — Telephone Encounter (Signed)
I sent in the requested prescription 

## 2018-08-30 LAB — URINE CULTURE: ORGANISM ID, BACTERIA: NO GROWTH

## 2018-10-18 ENCOUNTER — Ambulatory Visit: Payer: BLUE CROSS/BLUE SHIELD | Admitting: Family Medicine

## 2018-10-18 ENCOUNTER — Encounter: Payer: Self-pay | Admitting: Family Medicine

## 2018-10-18 VITALS — BP 122/75 | HR 86 | Temp 97.2°F | Ht 77.0 in | Wt 230.0 lb

## 2018-10-18 DIAGNOSIS — N3289 Other specified disorders of bladder: Secondary | ICD-10-CM | POA: Diagnosis not present

## 2018-10-18 DIAGNOSIS — M62830 Muscle spasm of back: Secondary | ICD-10-CM | POA: Diagnosis not present

## 2018-10-18 MED ORDER — VITAMIN D (ERGOCALCIFEROL) 1.25 MG (50000 UNIT) PO CAPS: 50000.0000 [IU] | ORAL_CAPSULE | ORAL | 1 refills | Status: DC

## 2018-10-18 MED ORDER — NIFEDIPINE ER 30 MG PO TB24: ORAL_TABLET | ORAL | 1 refills | Status: DC

## 2018-10-18 MED ORDER — CYCLOBENZAPRINE HCL 10 MG PO TABS: 10.0000 mg | ORAL_TABLET | Freq: Three times a day (TID) | ORAL | 5 refills | Status: DC | PRN

## 2018-10-18 MED ORDER — OMEPRAZOLE 40 MG PO CPDR: DELAYED_RELEASE_CAPSULE | ORAL | 1 refills | Status: DC

## 2018-10-18 MED ORDER — TAMSULOSIN HCL 0.4 MG PO CAPS: 0.8000 mg | ORAL_CAPSULE | Freq: Every day | ORAL | 1 refills | Status: DC

## 2018-10-18 MED ORDER — SOLIFENACIN SUCCINATE 10 MG PO TABS: 10.0000 mg | ORAL_TABLET | Freq: Every day | ORAL | 1 refills | Status: DC

## 2018-10-18 NOTE — Patient Instructions (Signed)
Please follow up with Dr. Livia Snellen in 6 months.  Please do back strengthening exercises.      Back Exercises If you have pain in your back, do these exercises 2-3 times each day or as told by your doctor. When the pain goes away, do the exercises once each day, but repeat the steps more times for each exercise (do more repetitions). If you do not have pain in your back, do these exercises once each day or as told by your doctor. Exercises Single Knee to Chest  Do these steps 3-5 times in a row for each leg: 1. Lie on your back on a firm bed or the floor with your legs stretched out. 2. Bring one knee to your chest. 3. Hold your knee to your chest by grabbing your knee or thigh. 4. Pull on your knee until you feel a gentle stretch in your lower back. 5. Keep doing the stretch for 10-30 seconds. 6. Slowly let go of your leg and straighten it.  Pelvic Tilt  Do these steps 5-10 times in a row: 1. Lie on your back on a firm bed or the floor with your legs stretched out. 2. Bend your knees so they point up to the ceiling. Your feet should be flat on the floor. 3. Tighten your lower belly (abdomen) muscles to press your lower back against the floor. This will make your tailbone point up to the ceiling instead of pointing down to your feet or the floor. 4. Stay in this position for 5-10 seconds while you gently tighten your muscles and breathe evenly.  Cat-Cow  Do these steps until your lower back bends more easily: 1. Get on your hands and knees on a firm surface. Keep your hands under your shoulders, and keep your knees under your hips. You may put padding under your knees. 2. Let your head hang down, and make your tailbone point down to the floor so your lower back is round like the back of a cat. 3. Stay in this position for 5 seconds. 4. Slowly lift your head and make your tailbone point up to the ceiling so your back hangs low (sags) like the back of a cow. 5. Stay in this position for  5 seconds.  Press-Ups  Do these steps 5-10 times in a row: 1. Lie on your belly (face-down) on the floor. 2. Place your hands near your head, about shoulder-width apart. 3. While you keep your back relaxed and keep your hips on the floor, slowly straighten your arms to raise the top half of your body and lift your shoulders. Do not use your back muscles. To make yourself more comfortable, you may change where you place your hands. 4. Stay in this position for 5 seconds. 5. Slowly return to lying flat on the floor.  Bridges  Do these steps 10 times in a row: 1. Lie on your back on a firm surface. 2. Bend your knees so they point up to the ceiling. Your feet should be flat on the floor. 3. Tighten your butt muscles and lift your butt off of the floor until your waist is almost as high as your knees. If you do not feel the muscles working in your butt and the back of your thighs, slide your feet 1-2 inches farther away from your butt. 4. Stay in this position for 3-5 seconds. 5. Slowly lower your butt to the floor, and let your butt muscles relax.  If this exercise is too  easy, try doing it with your arms crossed over your chest. Belly Crunches  Do these steps 5-10 times in a row: 1. Lie on your back on a firm bed or the floor with your legs stretched out. 2. Bend your knees so they point up to the ceiling. Your feet should be flat on the floor. 3. Cross your arms over your chest. 4. Tip your chin a little bit toward your chest but do not bend your neck. 5. Tighten your belly muscles and slowly raise your chest just enough to lift your shoulder blades a tiny bit off of the floor. 6. Slowly lower your chest and your head to the floor.  Back Lifts Do these steps 5-10 times in a row: 1. Lie on your belly (face-down) with your arms at your sides, and rest your forehead on the floor. 2. Tighten the muscles in your legs and your butt. 3. Slowly lift your chest off of the floor while you  keep your hips on the floor. Keep the back of your head in line with the curve in your back. Look at the floor while you do this. 4. Stay in this position for 3-5 seconds. 5. Slowly lower your chest and your face to the floor.  Contact a doctor if:  Your back pain gets a lot worse when you do an exercise.  Your back pain does not lessen 2 hours after you exercise. If you have any of these problems, stop doing the exercises. Do not do them again unless your doctor says it is okay. Get help right away if:  You have sudden, very bad back pain. If this happens, stop doing the exercises. Do not do them again unless your doctor says it is okay. This information is not intended to replace advice given to you by your health care provider. Make sure you discuss any questions you have with your health care provider. Document Released: 01/07/2011 Document Revised: 05/12/2016 Document Reviewed: 01/29/2015 Elsevier Interactive Patient Education  [employer].

## 2018-10-18 NOTE — Progress Notes (Signed)
Subjective:  Patient ID: Danny Snyder, male    DOB: 11-Mar-1966  Age: 52 y.o. MRN: [id number]  CC: No chief complaint on file.   HPI Danny Snyder presents for recheck of bladder spasm and BPH.  Urine flow is much better. Up only once a night most nights. Occasionally twice. Meds agreeing with him. Couldn't get myrbetriq, but vesicare was substituted and is working out well.   Now having more low back pain. Left lumbar region. No radiation. Onset 1 week ago.  Chronic recurring. Worsening through the week  Depression screen [facility] 2/9 10/18/2018 08/29/2018 02/14/2018  Decreased Interest 0 0 0  Down, Depressed, Hopeless 0 0 0  PHQ - 2 Score 0 0 0    History Danny Snyder has a past medical history of Allergy, Anxiety, Arthritis, GERD (gastroesophageal reflux disease), and Raynaud's disease.   He has no past surgical history on file.   His family history includes Arthritis in his brother and mother; Cancer in his father; Diabetes in his father.He reports that he quit smoking about 16 years ago. He has quit using smokeless tobacco.  His smokeless tobacco use included chew. He reports that he drinks alcohol. He reports that he does not use drugs.    ROS Review of Systems  Constitutional: Negative for fever.  Respiratory: Negative for shortness of breath.   Cardiovascular: Negative for chest pain.  Genitourinary: Positive for frequency.  Musculoskeletal: Negative for arthralgias.  Skin: Negative for rash.    Objective:  BP 122/75   Pulse 86   Temp (!) 97.2 F (36.2 C)   Ht 6\' 5"  (1.956 m)   Wt 230 lb (104.3 kg)   BMI 27.27 kg/m   BP Readings from Last 3 Encounters:  10/18/18 122/75  08/29/18 137/76  02/14/18 123/69    Wt Readings from Last 3 Encounters:  10/18/18 230 lb (104.3 kg)  08/29/18 224 lb 8 oz (101.8 kg)  02/14/18 236 lb (107 kg)     Physical Exam  Constitutional: He appears well-developed and well-nourished.  HENT:  Head: Normocephalic and atraumatic.  Right  Ear: Tympanic membrane and external ear normal. No decreased hearing is noted.  Left Ear: Tympanic membrane and external ear normal. No decreased hearing is noted.  Mouth/Throat: No oropharyngeal exudate or posterior oropharyngeal erythema.  Eyes: Pupils are equal, round, and reactive to light.  Neck: Normal range of motion. Neck supple.  Cardiovascular: Normal rate and regular rhythm.  No murmur heard. Pulmonary/Chest: Breath sounds normal. No respiratory distress.  Abdominal: Soft. Bowel sounds are normal. He exhibits no mass. There is no tenderness.  Vitals reviewed.     Assessment & Plan:   Diagnoses and all orders for this visit:  Bladder spasms  Lumbar paraspinal muscle spasm  Other orders -     cyclobenzaprine (FLEXERIL) 10 MG tablet; Take 1 tablet (10 mg total) by mouth 3 (three) times daily as needed for muscle spasms. -     NIFEdipine (ADALAT CC) 30 MG 24 hr tablet; TAKE 1 TABLET (30 MG TOTAL) BY MOUTH DAILY. -     omeprazole (PRILOSEC) 40 MG capsule; TAKE 1 CAPSULE (40 MG TOTAL) BY MOUTH DAILY. -     solifenacin (VESICARE) 10 MG tablet; Take 1 tablet (10 mg total) by mouth daily. -     tamsulosin (FLOMAX) 0.4 MG CAPS capsule; Take 2 capsules (0.8 mg total) by mouth at bedtime. -     Vitamin D, Ergocalciferol, (DRISDOL) 50000 units CAPS capsule; Take 1 capsule (  50,000 Units total) by mouth every 7 (seven) days.       I have changed [place] NIFEdipine. I am also having him start on cyclobenzaprine. Additionally, I am having him maintain his aspirin, magnesium gluconate, diclofenac, fluticasone, omeprazole, solifenacin, tamsulosin, and Vitamin D (Ergocalciferol). We will continue to administer sodium chloride.  Allergies as of 10/18/2018      Reactions   Lodine [etodolac] Other (See Comments)   Mouth sores       Medication List        Accurate as of 10/18/18 11:59 PM. Always use your most recent med list.          aspirin 81 MG tablet Take 81 mg  by mouth daily.   cyclobenzaprine 10 MG tablet Commonly known as:  FLEXERIL Take 1 tablet (10 mg total) by mouth 3 (three) times daily as needed for muscle spasms.   diclofenac 75 MG EC tablet Commonly known as:  VOLTAREN Take 75 mg by mouth 2 (two) times daily.   fluticasone 50 MCG/ACT nasal spray Commonly known as:  FLONASE Place 1 spray into both nostrils daily as needed for allergies or rhinitis.   magnesium gluconate 500 MG tablet Commonly known as:  MAGONATE Take 500 mg by mouth 2 (two) times daily.   NIFEdipine 30 MG 24 hr tablet Commonly known as:  ADALAT CC TAKE 1 TABLET (30 MG TOTAL) BY MOUTH DAILY.   omeprazole 40 MG capsule Commonly known as:  PRILOSEC TAKE 1 CAPSULE (40 MG TOTAL) BY MOUTH DAILY.   solifenacin 10 MG tablet Commonly known as:  VESICARE Take 1 tablet (10 mg total) by mouth daily.   tamsulosin 0.4 MG Caps capsule Commonly known as:  FLOMAX Take 2 capsules (0.8 mg total) by mouth at bedtime.   Vitamin D (Ergocalciferol) 50000 units Caps capsule Commonly known as:  DRISDOL Take 1 capsule (50,000 Units total) by mouth every 7 (seven) days.        Follow-up: Return in about 3 months (around 01/18/2019).  Claretta Fraise, M.D.

## 2018-10-21 ENCOUNTER — Encounter: Payer: Self-pay | Admitting: Family Medicine

## 2018-11-26 ENCOUNTER — Ambulatory Visit (INDEPENDENT_AMBULATORY_CARE_PROVIDER_SITE_OTHER): Payer: BLUE CROSS/BLUE SHIELD

## 2018-11-26 ENCOUNTER — Ambulatory Visit: Payer: BLUE CROSS/BLUE SHIELD | Admitting: Family Medicine

## 2018-11-26 ENCOUNTER — Encounter: Payer: Self-pay | Admitting: Family Medicine

## 2018-11-26 VITALS — BP 117/70 | HR 91 | Temp 97.9°F | Ht 77.0 in | Wt 227.0 lb

## 2018-11-26 DIAGNOSIS — M778 Other enthesopathies, not elsewhere classified: Secondary | ICD-10-CM

## 2018-11-26 DIAGNOSIS — M25522 Pain in left elbow: Secondary | ICD-10-CM

## 2018-11-26 DIAGNOSIS — S59902A Unspecified injury of left elbow, initial encounter: Secondary | ICD-10-CM | POA: Diagnosis not present

## 2018-11-26 MED ORDER — DICLOFENAC SODIUM 75 MG PO TBEC: 75.0000 mg | DELAYED_RELEASE_TABLET | Freq: Two times a day (BID) | ORAL | 1 refills | Status: DC

## 2018-11-26 MED ORDER — PREDNISONE 10 MG PO TABS: ORAL_TABLET | ORAL | 0 refills | Status: DC

## 2018-11-26 NOTE — Progress Notes (Signed)
Chief Complaint  Patient presents with  . left elbow pain    "popped" a few weeks ago    HPI  Patient presents today for left elbow pain for 2 weeks.  Patient was lifting something with his arm fully extended moderate weight involved.  He felt a pop and sudden pain.  Has been persistent for a couple weeks except that it is better today.  However he says that it some better today than its been being and that when he is not lifting anything he does not feel the pain.  He felt a lot of pain this morning when he tried to lift a motor.  He is able to continue his work however.  PMH: Smoking status noted ROS: Per HPI  Objective: BP 117/70 (BP Location: Left Arm)   Pulse 91   Temp 97.9 F (36.6 C) (Oral)   Ht 6\' 5"  (1.956 m)   Wt 227 lb (103 kg)   BMI 26.92 kg/m  Gen: NAD, alert, cooperative with exam HEENT: NCAT Ext: left elbow tender at olecranon and triceps tendon. FROM 5/5 strength. NVintact LUE. Neuro: Alert and oriented, No gross deficits X-ray: No apparent fracture preliminary reading by me.  Radiology opinion pending. Assessment and plan:  1. Left elbow pain   2. Left elbow tendonitis     Meds ordered this encounter  Medications  . predniSONE (DELTASONE) 10 MG tablet    Sig: Take 5 daily for 2 days followed by 4,3,2 and 1 for 2 days each.    Dispense:  30 tablet    Refill:  0    Orders Placed This Encounter  Procedures  . DG Elbow 2 Views Left    Standing Status:   Future    Number of Occurrences:   1    Standing Expiration Date:   01/26/2020    Order Specific Question:   Reason for Exam (SYMPTOM  OR DIAGNOSIS REQUIRED)    Answer:   left elbow pain    Order Specific Question:   Preferred imaging location?    Answer:   Internal    Follow up as needed.  Claretta Fraise, MD

## 2018-12-09 ENCOUNTER — Telehealth: Payer: BLUE CROSS/BLUE SHIELD | Admitting: Physician Assistant

## 2018-12-09 DIAGNOSIS — R42 Dizziness and giddiness: Secondary | ICD-10-CM

## 2018-12-09 DIAGNOSIS — R6884 Jaw pain: Secondary | ICD-10-CM

## 2018-12-09 DIAGNOSIS — J321 Chronic frontal sinusitis: Secondary | ICD-10-CM | POA: Diagnosis not present

## 2018-12-09 DIAGNOSIS — J40 Bronchitis, not specified as acute or chronic: Secondary | ICD-10-CM | POA: Diagnosis not present

## 2018-12-09 NOTE — Progress Notes (Signed)
Based on what you shared with me it looks like you have a condition that should be evaluated in a face to face office visit. Giving jaw pain and dizziness, you need a good physical examination to make sure the appropriate treatment is given.  NOTE: If you entered your credit card information for this eVisit, you will not be charged. You may see a "hold" on your card for the $30 but that hold will drop off and you will not have a charge processed.  If you are having a true medical emergency please call 911.  If you need an urgent face to face visit, [place] has four urgent care centers for your convenience.  If you need care fast and have a high deductible or no insurance consider:   [email or web address] to reserve your spot online an avoid wait times  [facility] [address], [address] [place], [place] [address] 8 am to 8 pm Monday-Friday 10 am to 4 pm Saturday-Sunday *Across the street from [employer]  [place], [address] 8 am to 5 pm Monday-Friday * In the [facility] on the [facility]   The following sites will take your  insurance:  . [facility] [place] a Provider at this Location  [address] [place], [place] [address] . 10 am to 8 pm Monday-Friday . 12 pm to 8 pm Saturday-Sunday   . [facility] at [place] a Provider at this Location  [address], [place], [place] [address] . 8 am to 8 pm Monday-Friday . 9 am to 6 pm Saturday . 11 am to 6 pm Sunday   . [facility] at [place] Get Driving Directions  [address] Suite [place], [place] [address] . 8 am to 8 pm Monday-Friday . 8 am to 4 pm Saturday-Sunday   Your e-visit answers were reviewed by a board certified advanced clinical practitioner to complete  your personal care plan.  Thank you for using e-Visits.

## 2019-01-02 ENCOUNTER — Other Ambulatory Visit: Payer: Self-pay | Admitting: *Deleted

## 2019-01-02 MED ORDER — NIFEDIPINE ER 30 MG PO TB24: ORAL_TABLET | ORAL | 0 refills | Status: DC

## 2019-01-02 NOTE — Telephone Encounter (Signed)
Fax from [place] new Rx for Nifedipine 30 mg Pt recently transferred to pharmacy

## 2019-01-10 ENCOUNTER — Ambulatory Visit: Payer: BLUE CROSS/BLUE SHIELD | Admitting: Family

## 2019-01-10 ENCOUNTER — Encounter: Payer: Self-pay | Admitting: Family

## 2019-01-10 VITALS — BP 119/76 | HR 102 | Temp 98.2°F | Ht 77.0 in | Wt 235.0 lb

## 2019-01-10 DIAGNOSIS — J101 Influenza due to other identified influenza virus with other respiratory manifestations: Secondary | ICD-10-CM | POA: Diagnosis not present

## 2019-01-10 DIAGNOSIS — R6889 Other general symptoms and signs: Secondary | ICD-10-CM | POA: Diagnosis not present

## 2019-01-10 LAB — VERITOR FLU A/B WAIVED
INFLUENZA A: POSITIVE — AB
INFLUENZA B: NEGATIVE

## 2019-01-10 MED ORDER — OSELTAMIVIR PHOSPHATE 75 MG PO CAPS: 75.0000 mg | ORAL_CAPSULE | Freq: Two times a day (BID) | ORAL | 0 refills | Status: DC

## 2019-01-10 NOTE — Progress Notes (Signed)
Subjective:    Patient ID: Danny Snyder, male    DOB: 05/12/1966, 53 y.o.   MRN: [id number]  Chief Complaint  Patient presents with  . Cough and congestion  . Generalized Body Aches    Cough  This is a new (PT states his wife was diagnosed with Influzena ) problem. The current episode started yesterday. The problem has been waxing and waning. The problem occurs every few minutes. The cough is productive of sputum. Associated symptoms include chills, headaches, myalgias, postnasal drip, a sore throat and shortness of breath ("at times"). Pertinent negatives include no ear congestion, ear pain, fever or wheezing. He has tried rest and OTC cough suppressant for the symptoms. The treatment provided mild relief.      Review of Systems  Constitutional: Positive for chills. Negative for fever.  HENT: Positive for postnasal drip and sore throat. Negative for ear pain.   Respiratory: Positive for cough and shortness of breath ("at times"). Negative for wheezing.   Musculoskeletal: Positive for myalgias.  Neurological: Positive for headaches.  All other systems reviewed and are negative.      Objective:   Physical Exam Vitals signs reviewed.  Constitutional:      General: He is not in acute distress.    Appearance: He is well-developed.  HENT:     Head: Normocephalic.     Right Ear: Tympanic membrane normal.     Left Ear: Tympanic membrane normal.     Nose: Mucosal edema present.     Mouth/Throat:     Pharynx: Posterior oropharyngeal erythema present.  Eyes:     General:        Right eye: No discharge.        Left eye: No discharge.     Pupils: Pupils are equal, round, and reactive to light.  Neck:     Musculoskeletal: Normal range of motion and neck supple.     Thyroid: No thyromegaly.  Cardiovascular:     Rate and Rhythm: Normal rate and regular rhythm.     Heart sounds: Normal heart sounds. No murmur.  Pulmonary:     Effort: Pulmonary effort is normal. No respiratory  distress.     Breath sounds: Normal breath sounds. No wheezing.  Abdominal:     General: Bowel sounds are normal. There is no distension.     Palpations: Abdomen is soft.     Tenderness: There is no abdominal tenderness.  Musculoskeletal: Normal range of motion.        General: No tenderness.  Skin:    General: Skin is warm and dry.     Findings: No erythema or rash.  Neurological:     Mental Status: He is alert and oriented to person, place, and time.     Cranial Nerves: No cranial nerve deficit.     Deep Tendon Reflexes: Reflexes are normal and symmetric.  Psychiatric:        Behavior: Behavior normal.        Thought Content: Thought content normal.        Judgment: Judgment normal.       BP 119/76   Pulse (!) 102   Temp 98.2 F (36.8 C) (Oral)   Ht 6\' 5"  (1.956 m)   Wt 235 lb (106.6 kg)   BMI 27.87 kg/m      Assessment & Plan:  Danny Snyder comes in today with chief complaint of Cough and congestion and Generalized Body Aches   Diagnosis and orders addressed:  1. Flu-like symptoms - Veritor Flu A/B Waived  2. Influenza A Force fluids Rest Tylenol or Motrin prn for fever or aches Droplet precautions discussed RTO if symptoms worsen or do not improve - oseltamivir (TAMIFLU) 75 MG capsule; Take 1 capsule (75 mg total) by mouth 2 (two) times daily.  Dispense: 10 capsule; Refill: 0  [name], FNP

## 2019-01-10 NOTE — Patient Instructions (Signed)

## 2019-04-17 ENCOUNTER — Ambulatory Visit (INDEPENDENT_AMBULATORY_CARE_PROVIDER_SITE_OTHER): Payer: BLUE CROSS/BLUE SHIELD | Admitting: Family Medicine

## 2019-04-17 ENCOUNTER — Encounter: Payer: Self-pay | Admitting: Family Medicine

## 2019-04-17 ENCOUNTER — Other Ambulatory Visit: Payer: Self-pay

## 2019-04-17 DIAGNOSIS — K2100 Gastro-esophageal reflux disease with esophagitis, without bleeding: Secondary | ICD-10-CM | POA: Insufficient documentation

## 2019-04-17 DIAGNOSIS — I7300 Raynaud's syndrome without gangrene: Secondary | ICD-10-CM

## 2019-04-17 DIAGNOSIS — M150 Primary generalized (osteo)arthritis: Secondary | ICD-10-CM

## 2019-04-17 DIAGNOSIS — N3289 Other specified disorders of bladder: Secondary | ICD-10-CM

## 2019-04-17 DIAGNOSIS — M159 Polyosteoarthritis, unspecified: Secondary | ICD-10-CM

## 2019-04-17 DIAGNOSIS — M8949 Other hypertrophic osteoarthropathy, multiple sites: Secondary | ICD-10-CM

## 2019-04-17 MED ORDER — NIFEDIPINE ER 30 MG PO TB24: ORAL_TABLET | ORAL | 3 refills | Status: DC

## 2019-04-17 MED ORDER — OMEPRAZOLE 40 MG PO CPDR: DELAYED_RELEASE_CAPSULE | ORAL | 1 refills | Status: DC

## 2019-04-17 NOTE — Progress Notes (Signed)
Subjective:    Patient ID: Danny Snyder, male    DOB: 10-18-1966, 53 y.o.   MRN: [id number]   HPI: JADIER ROCKERS is a 53 y.o. male presenting for follow up of his chronic medical conditions.  Currently he says he is doing very well.  He is following his own blood pressure. BP 120s/70s on multiple readings. Work has slowed down due to [place].  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  He tells me that he is taking the nifedipine daily.  It is working wonders for his ray nodes.  He has had no numbness or pain.  At one time we discussed going off of it during the warmer months but he feels that he is just not willing to take that chance.  Patient is taking his omeprazole daily for his reflux.,  However he is having more more heartburn and reflux.  Over-the-counter medicines help some.  There is no hematemesis or melena.  No diarrhea or constipation.  Patient does watch his diet but when he eats something that is a bit suspicious he will have significant discomfort.  The patient is taking his omeprazole with food though.  He gets up takes his food and eats breakfast in short order together.  He is taking the diclofenac with food as well.  This keeps his arthritis pain under good control and he feels that it is important to continue if at all possible.   Depression screen [facility] 2/9 01/10/2019 11/26/2018 10/18/2018 08/29/2018 02/14/2018  Decreased Interest 0 0 0 0 0  Down, Depressed, Hopeless 0 0 0 0 0  PHQ - 2 Score 0 0 0 0 0   Patient Active Problem List   Diagnosis Date Noted  . Primary osteoarthritis involving multiple joints 08/29/2018  . Bladder spasms 08/29/2018  . Thumb pain 02/12/2015  . Raynaud's syndrome 02/12/2015     Relevant past medical, surgical, family and social history reviewed and updated as indicated.  Interim medical history since our last visit reviewed. Allergies and medications reviewed and updated.  ROS:  Review of Systems  Constitutional:  Negative.   HENT: Negative.   Eyes: Negative for visual disturbance.  Respiratory: Negative for cough and shortness of breath.   Cardiovascular: Negative for chest pain and leg swelling.  Gastrointestinal: Positive for abdominal pain (increasing indigestion and reflux). Negative for diarrhea, nausea and vomiting.  Genitourinary: Negative for difficulty urinating.  Musculoskeletal: Negative for arthralgias and myalgias.  Skin: Negative for rash.  Neurological: Negative for headaches.  Psychiatric/Behavioral: Negative for sleep disturbance.     Social History   Tobacco Use  Smoking Status Former Smoker  . Last attempt to quit: 2003  . Years since quitting: 17.3  Smokeless Tobacco Former Systems developer  . Types: Chew       Objective:     Wt Readings from Last 3 Encounters:  01/10/19 235 lb (106.6 kg)  11/26/18 227 lb (103 kg)  10/18/18 230 lb (104.3 kg)     Exam deferred. Pt. Harboring due to COVID 19. Phone visit performed.   Assessment & Plan:   1. Bladder spasms   2. Raynaud's disease without gangrene   3. Primary osteoarthritis involving multiple joints     Meds ordered this encounter  Medications  . DISCONTD: NIFEdipine (ADALAT CC) 30 MG 24 hr tablet    Sig: TAKE 1 TABLET (30 MG TOTAL) BY MOUTH DAILY. Take at least two hours after last food intake.  Do not eat for one hour after taking.    Dispense:  90 tablet    Refill:  3  . NIFEdipine (ADALAT CC) 30 MG 24 hr tablet    Sig: TAKE 1 TABLET (30 MG TOTAL) BY MOUTH DAILY.    Dispense:  90 tablet    Refill:  3    Replaces scrip sent just prior to this  . omeprazole (PRILOSEC) 40 MG capsule    Sig: TAKE 1 CAPSULE (40 MG TOTAL) BY MOUTH DAILY. Take at least two hours after last food intake. Do not eat for one hour after taking.    Dispense:  90 capsule    Refill:  1    No orders of the defined types were placed in this encounter.     Diagnoses and all orders for this visit:  Bladder spasms  Raynaud's disease  without gangrene  Primary osteoarthritis involving multiple joints  Other orders -     Discontinue: NIFEdipine (ADALAT CC) 30 MG 24 hr tablet; TAKE 1 TABLET (30 MG TOTAL) BY MOUTH DAILY. Take at least two hours after last food intake. Do not eat for one hour after taking. -     NIFEdipine (ADALAT CC) 30 MG 24 hr tablet; TAKE 1 TABLET (30 MG TOTAL) BY MOUTH DAILY. -     omeprazole (PRILOSEC) 40 MG capsule; TAKE 1 CAPSULE (40 MG TOTAL) BY MOUTH DAILY. Take at least two hours after last food intake. Do not eat for one hour after taking.    Virtual Visit via telephone Note  I discussed the limitations, risks, security and privacy concerns of performing an evaluation and management service by telephone and the availability of in person appointments. The patient was identified with two identifiers. Pt.expressed understanding and agreed to proceed. Pt. Is at work. Dr. Livia Snellen is in his office.  Follow Up Instructions:   I discussed the assessment and treatment plan with the patient. The patient was provided an opportunity to ask questions and all were answered. The patient agreed with the plan and demonstrated an understanding of the instructions.   The patient was advised to call back or seek an in-person evaluation if the symptoms worsen or if the condition fails to improve as anticipated.  Visit started: 8:30 Call ended:  8:47 Total minutes including chart review and phone contact time: 27   Follow up plan: No follow-ups on file.  Claretta Fraise, MD [place]

## 2019-04-29 ENCOUNTER — Other Ambulatory Visit: Payer: Self-pay | Admitting: Family Medicine

## 2019-04-29 NOTE — Telephone Encounter (Signed)
Last vitamin d 01/2018. Needs labs

## 2019-05-18 ENCOUNTER — Telehealth: Payer: Self-pay | Admitting: Physician Assistant

## 2019-05-20 NOTE — Telephone Encounter (Signed)
Thanks so much for your report. You were definitely thorough. I will give him a call between patients today. Cletus Gash

## 2019-05-27 ENCOUNTER — Other Ambulatory Visit: Payer: Self-pay | Admitting: Family Medicine

## 2019-07-29 ENCOUNTER — Other Ambulatory Visit: Payer: Self-pay | Admitting: Family Medicine

## 2019-08-27 ENCOUNTER — Other Ambulatory Visit: Payer: Self-pay | Admitting: Family Medicine

## 2019-08-28 ENCOUNTER — Other Ambulatory Visit: Payer: Self-pay | Admitting: Family Medicine

## 2019-09-25 ENCOUNTER — Ambulatory Visit (INDEPENDENT_AMBULATORY_CARE_PROVIDER_SITE_OTHER): Payer: BLUE CROSS/BLUE SHIELD | Admitting: Family Medicine

## 2019-09-25 ENCOUNTER — Encounter: Payer: Self-pay | Admitting: Family Medicine

## 2019-09-25 DIAGNOSIS — K2100 Gastro-esophageal reflux disease with esophagitis, without bleeding: Secondary | ICD-10-CM

## 2019-09-25 MED ORDER — OMEPRAZOLE 40 MG PO CPDR: 40.0000 mg | DELAYED_RELEASE_CAPSULE | Freq: Two times a day (BID) | ORAL | 1 refills | Status: DC

## 2019-09-25 NOTE — Progress Notes (Signed)
Subjective:    Patient ID: Danny Snyder, male    DOB: Sep 25, 1966, 53 y.o.   MRN: [id number]   HPI: Danny Snyder is a 53 y.o. male presenting for Patient in for follow-up of GERD. Currently symptomatic taking  PPI daily.  There is no chest pain but he is having frequent heartburn. No hematemesis and no melena. No dysphagia or choking. Sx are increasing and per previous discussion he tried increasing to BID PPI.  This resolves symptoms. He needs an updated prescription. Onset is two months ago since condition started worsening. Onset of GERD is  remote.   Depression screen [facility] 2/9 01/10/2019 11/26/2018 10/18/2018 08/29/2018 02/14/2018  Decreased Interest 0 0 0 0 0  Down, Depressed, Hopeless 0 0 0 0 0  PHQ - 2 Score 0 0 0 0 0     Relevant past medical, surgical, family and social history reviewed and updated as indicated.  Interim medical history since our last visit reviewed. Allergies and medications reviewed and updated.  ROS:  Review of Systems  Constitutional: Negative for chills, diaphoresis, fever and unexpected weight change.  HENT: Negative for rhinorrhea and trouble swallowing.   Respiratory: Negative for cough, chest tightness and shortness of breath.   Cardiovascular: Negative for chest pain.  Gastrointestinal: Positive for abdominal pain (heartburn - epigastric, see HPI). Negative for abdominal distention, blood in stool, constipation, diarrhea, nausea, rectal pain and vomiting.  Genitourinary: Negative for dysuria, flank pain and hematuria.  Musculoskeletal: Negative for arthralgias and joint swelling.  Skin: Negative for rash.  Neurological: Negative for syncope and headaches.     Social History   Tobacco Use  Smoking Status Former Smoker  . Quit date: 2003  . Years since quitting: 17.7  Smokeless Tobacco Former Systems developer  . Types: Chew       Objective:     Wt Readings from Last 3 Encounters:  01/10/19 235 lb (106.6 kg)  11/26/18 227 lb (103 kg)  10/18/18  230 lb (104.3 kg)     Exam deferred due to COVID 19. Phone visit performed.   Assessment & Plan:   1. Gastroesophageal reflux disease with esophagitis without hemorrhage     Meds ordered this encounter  Medications  . omeprazole (PRILOSEC) 40 MG capsule    Sig: Take 1 capsule (40 mg total) by mouth 2 (two) times daily. TAKE 1 CAPSULE (40 MG TOTAL) BY MOUTH DAILY. Take at least two hours after last food intake. Do not eat for one hour after taking.    Dispense:  180 capsule    Refill:  1    Diagnoses and all orders for this visit:  Gastroesophageal reflux disease with esophagitis without hemorrhage  Other orders -     omeprazole (PRILOSEC) 40 MG capsule; Take 1 capsule (40 mg total) by mouth 2 (two) times daily. TAKE 1 CAPSULE (40 MG TOTAL) BY MOUTH DAILY. Take at least two hours after last food intake. Do not eat for one hour after taking.    Virtual Visit via telephone Note  I discussed the limitations, risks, security and privacy concerns of performing an evaluation and management service by telephone and the availability of in person appointments. The patient was identified with two identifiers. Pt.expressed understanding and agreed to proceed. Pt. Is at home. Dr. Livia Snellen is in his office.  Follow Up Instructions:   I discussed the assessment and treatment plan with the patient. The patient was provided an opportunity to ask questions and all were answered.  The patient agreed with the plan and demonstrated an understanding of the instructions.   The patient was advised to call back or seek an in-person evaluation if the symptoms worsen or if the condition fails to improve as anticipated.   Total minutes including chart review and phone contact time: 21   Follow up plan: Return in about 1 month (around 10/26/2019) for Wellness.  Claretta Fraise, MD [place]

## 2019-10-08 ENCOUNTER — Encounter (HOSPITAL_COMMUNITY): Payer: Self-pay | Admitting: Emergency Medicine

## 2019-10-08 ENCOUNTER — Emergency Department (HOSPITAL_COMMUNITY): Payer: BLUE CROSS/BLUE SHIELD

## 2019-10-08 ENCOUNTER — Emergency Department (HOSPITAL_COMMUNITY)
Admission: EM | Admit: 2019-10-08 | Discharge: 2019-10-08 | Disposition: A | Discharge status: Home / Self Care | Payer: BLUE CROSS/BLUE SHIELD | Attending: Emergency Medicine | Admitting: Emergency Medicine

## 2019-10-08 DIAGNOSIS — R200 Anesthesia of skin: Secondary | ICD-10-CM | POA: Diagnosis not present

## 2019-10-08 DIAGNOSIS — I10 Essential (primary) hypertension: Secondary | ICD-10-CM | POA: Insufficient documentation

## 2019-10-08 DIAGNOSIS — Z7982 Long term (current) use of aspirin: Secondary | ICD-10-CM | POA: Insufficient documentation

## 2019-10-08 DIAGNOSIS — Z87891 Personal history of nicotine dependence: Secondary | ICD-10-CM | POA: Insufficient documentation

## 2019-10-08 DIAGNOSIS — Z79899 Other long term (current) drug therapy: Secondary | ICD-10-CM | POA: Insufficient documentation

## 2019-10-08 DIAGNOSIS — R202 Paresthesia of skin: Secondary | ICD-10-CM | POA: Insufficient documentation

## 2019-10-08 LAB — DIFFERENTIAL
Abs Immature Granulocytes: 0.02 10*3/uL (ref 0.00–0.07)
Basophils Absolute: 0 10*3/uL (ref 0.0–0.1)
Basophils Relative: 0 %
Eosinophils Absolute: 0.1 10*3/uL (ref 0.0–0.5)
Eosinophils Relative: 1 %
Immature Granulocytes: 0 %
Lymphocytes Relative: 24 %
Lymphs Abs: 1.5 10*3/uL (ref 0.7–4.0)
Monocytes Absolute: 0.6 10*3/uL (ref 0.1–1.0)
Monocytes Relative: 9 %
Neutro Abs: 4 10*3/uL (ref 1.7–7.7)
Neutrophils Relative %: 66 %

## 2019-10-08 LAB — COMPREHENSIVE METABOLIC PANEL
ALT: 33 U/L (ref 0–44)
AST: 24 U/L (ref 15–41)
Albumin: 4 g/dL (ref 3.5–5.0)
Alkaline Phosphatase: 50 U/L (ref 38–126)
Anion gap: 8 (ref 5–15)
BUN: 13 mg/dL (ref 6–20)
CO2: 27 mmol/L (ref 22–32)
Calcium: 9.3 mg/dL (ref 8.9–10.3)
Chloride: 106 mmol/L (ref 98–111)
Creatinine, Ser: 1.03 mg/dL (ref 0.61–1.24)
GFR calc Af Amer: >60 mL/min (ref >60)
GFR calc non Af Amer: >60 mL/min (ref >60)
Glucose, Bld: 90 mg/dL (ref 70–99)
Potassium: 4.3 mmol/L (ref 3.5–5.1)
Sodium: 141 mmol/L (ref 135–145)
Total Bilirubin: 0.5 mg/dL (ref 0.3–1.2)
Total Protein: 6.4 g/dL — ABNORMAL LOW (ref 6.5–8.1)

## 2019-10-08 LAB — CBC
HCT: 42.4 % (ref 39.0–52.0)
Hemoglobin: 14.2 g/dL (ref 13.0–17.0)
MCH: 29.5 pg (ref 26.0–34.0)
MCHC: 33.5 g/dL (ref 30.0–36.0)
MCV: 88 fL (ref 80.0–100.0)
Platelets: 176 10*3/uL (ref 150–400)
RBC: 4.82 MIL/uL (ref 4.22–5.81)
RDW: 13.7 % (ref 11.5–15.5)
WBC: 6.2 10*3/uL (ref 4.0–10.5)
nRBC: 0 % (ref 0.0–0.2)

## 2019-10-08 LAB — I-STAT CHEM 8, ED
BUN: 14 mg/dL (ref 6–20)
Calcium, Ion: 1.12 mmol/L — ABNORMAL LOW (ref 1.15–1.40)
Chloride: 105 mmol/L (ref 98–111)
Creatinine, Ser: 1 mg/dL (ref 0.61–1.24)
Glucose, Bld: 86 mg/dL (ref 70–99)
HCT: 42 % (ref 39.0–52.0)
Hemoglobin: 14.3 g/dL (ref 13.0–17.0)
Potassium: 4.2 mmol/L (ref 3.5–5.1)
Sodium: 141 mmol/L (ref 135–145)
TCO2: 24 mmol/L (ref 22–32)

## 2019-10-08 LAB — APTT: aPTT: 29 seconds (ref 24–36)

## 2019-10-08 LAB — TROPONIN I (HIGH SENSITIVITY): Troponin I (High Sensitivity): <2 ng/L (ref <18)

## 2019-10-08 LAB — PROTIME-INR
INR: 1 (ref 0.8–1.2)
Prothrombin Time: 13.1 seconds (ref 11.4–15.2)

## 2019-10-08 MED ORDER — SODIUM CHLORIDE 0.9% FLUSH: 3.0000 mL | Freq: Once | INTRAVENOUS | Status: DC

## 2019-10-08 NOTE — ED Triage Notes (Signed)
Pt in with c/o L face numbness x 3-4 days. States L hand numbness today beginning at 1000. MAE's equally, face symmetrical

## 2019-10-08 NOTE — ED Notes (Addendum)
Pt concerned that his BP has never ran high before today. Tech has rechecked 3 times, 166/114, 166/109 and 163/106

## 2019-10-08 NOTE — Discharge Instructions (Addendum)
I want you to follow-up with your PCP and discuss cardiac stress testing.

## 2019-10-11 NOTE — ED Provider Notes (Signed)
[facility] EMERGENCY DEPARTMENT Provider Note   CSN: [id number] Arrival date & time: 10/08/19  1457     History   Chief Complaint Chief Complaint  Patient presents with  . Facial tingling    HPI Danny Snyder is a 53 y.o. male.     HPI   53 year old male with hypertension and left facial/upper lip tingling.  Intermittent for the past couple days.  Denies any numbness or tingling elsewhere as mentioned in triage note.  Currently tingling has resolved.  He notes his blood pressure has been running higher than it typically does.  Reports compliance with medication.  No weakness.  No headaches.  No changes in vision or speech.  Has not felt confused.  Past Medical History:  Diagnosis Date  . Allergy   . Anxiety   . Arthritis   . GERD (gastroesophageal reflux disease)   . Raynaud's disease     Patient Active Problem List   Diagnosis Date Noted  . Gastroesophageal reflux disease with esophagitis 04/17/2019  . Primary osteoarthritis involving multiple joints 08/29/2018  . Bladder spasms 08/29/2018  . Thumb pain 02/12/2015  . Raynaud's syndrome 02/12/2015    History reviewed. No pertinent surgical history.      Home Medications    Prior to Admission medications   Medication Sig Start Date End Date Taking? Authorizing Provider  aspirin 81 MG tablet Take 81 mg by mouth daily.    [provider]  diclofenac (VOLTAREN) 75 MG EC tablet TAKE 1 TABLET 2 TIMES A DAY 05/27/19   Claretta Fraise, MD  fluticasone [facility]) 50 MCG/ACT nasal spray Place 1 spray into both nostrils daily as needed for allergies or rhinitis.    [provider]  magnesium gluconate (MAGONATE) 500 MG tablet Take 500 mg by mouth 2 (two) times daily.    [provider]  NIFEdipine (ADALAT CC) 30 MG 24 hr tablet TAKE 1 TABLET (30 MG TOTAL) BY MOUTH DAILY. 04/17/19   Claretta Fraise, MD  omeprazole (PRILOSEC) 40 MG capsule Take 1 capsule (40 mg total) by mouth 2  (two) times daily. TAKE 1 CAPSULE (40 MG TOTAL) BY MOUTH DAILY. Take at least two hours after last food intake. Do not eat for one hour after taking. 09/25/19   Claretta Fraise, MD  solifenacin (VESICARE) 10 MG tablet TAKE 1 TABLET DAILY 08/27/19   Claretta Fraise, MD  tamsulosin (FLOMAX) 0.4 MG CAPS capsule TAKE 2 CAPSULES AT BEDTIME 08/28/19   Claretta Fraise, MD  Vitamin D, Ergocalciferol, (DRISDOL) 1.25 MG (50000 UT) CAPS capsule TAKE 1 CAPSULE ONCE A WEEK 04/29/19   Claretta Fraise, MD    Family History Family History  Problem Relation Age of Onset  . Arthritis Mother   . Cancer Father   . Diabetes Father   . Arthritis Brother     Social History Social History   Tobacco Use  . Smoking status: Former Smoker    Quit date: 2003    Years since quitting: 17.8  . Smokeless tobacco: Former Systems developer    Types: Chew  Substance Use Topics  . Alcohol use: Yes    Comment: once a week.  . Drug use: No     Allergies   Lodine [etodolac]   Review of Systems Review of Systems  All systems reviewed and negative, other than as noted in HPI.  Physical Exam Updated Vital Signs BP (!) 137/100   Pulse 74   Temp 97.8 F (36.6 C) (Oral)   Resp  18   SpO2 98%   Physical Exam Vitals signs and nursing note reviewed.  Constitutional:      General: He is not in acute distress.    Appearance: He is well-developed.  HENT:     Head: Normocephalic and atraumatic.  Eyes:     General:        Right eye: No discharge.        Left eye: No discharge.     Conjunctiva/sclera: Conjunctivae normal.  Neck:     Musculoskeletal: Neck supple.  Cardiovascular:     Rate and Rhythm: Normal rate and regular rhythm.     Heart sounds: Normal heart sounds. No murmur. No friction rub. No gallop.   Pulmonary:     Effort: Pulmonary effort is normal. No respiratory distress.     Breath sounds: Normal breath sounds.  Abdominal:     General: There is no distension.     Palpations: Abdomen is soft.     Tenderness:  There is no abdominal tenderness.  Musculoskeletal:        General: No tenderness.  Skin:    General: Skin is warm and dry.  Neurological:     Mental Status: He is alert and oriented to person, place, and time.     Cranial Nerves: No cranial nerve deficit.     Sensory: No sensory deficit.     Motor: No weakness.     Coordination: Coordination normal.     Gait: Gait normal.  Psychiatric:        Behavior: Behavior normal.        Thought Content: Thought content normal.      ED Treatments / Results  Labs (all labs ordered are listed, but only abnormal results are displayed) Labs Reviewed  COMPREHENSIVE METABOLIC PANEL - Abnormal; Notable for the following components:      Result Value   Total Protein 6.4 (*)    All other components within normal limits  I-STAT CHEM 8, ED - Abnormal; Notable for the following components:   Calcium, Ion 1.12 (*)    All other components within normal limits  PROTIME-INR  APTT  CBC  DIFFERENTIAL  CBG MONITORING, ED  TROPONIN I (HIGH SENSITIVITY)    EKG None  Radiology No results found.  Procedures Procedures (including critical care time)  Medications Ordered in ED Medications - No data to display   Initial Impression / Assessment and Plan / ED Course  I have reviewed the triage vital signs and the nursing notes.  Pertinent labs & imaging results that were available during my care of the patient were reviewed by me and considered in my medical decision making (see chart for details).        53 year old male with hypertension and facial tingling.  I doubt that this is ACS or CVA.  Work-up reassuring.  There may potentially be some anxiety component.  Advised to keep a log of his blood pressure.  Continue his medications.  PCP follow-up.  Return precautions discussed.  Final Clinical Impressions(s) / ED Diagnoses   Final diagnoses:  Facial tingling  Hypertension  ED Discharge Orders    None       Virgel Manifold, MD  10/11/19 1019

## 2019-10-16 ENCOUNTER — Telehealth: Payer: Self-pay | Admitting: Family Medicine

## 2019-10-16 ENCOUNTER — Other Ambulatory Visit: Payer: Self-pay

## 2019-10-16 NOTE — Telephone Encounter (Signed)
Patient aware and verbalized understanding. °

## 2019-10-16 NOTE — Telephone Encounter (Signed)
They didn't do prostate or cholesterol blood work. Those should be done with his physical. I will put in the referral for colonoscopy WS

## 2019-10-17 ENCOUNTER — Encounter: Payer: Self-pay | Admitting: Family Medicine

## 2019-10-17 ENCOUNTER — Ambulatory Visit (INDEPENDENT_AMBULATORY_CARE_PROVIDER_SITE_OTHER): Payer: BLUE CROSS/BLUE SHIELD | Admitting: Family Medicine

## 2019-10-17 ENCOUNTER — Ambulatory Visit (INDEPENDENT_AMBULATORY_CARE_PROVIDER_SITE_OTHER): Payer: BLUE CROSS/BLUE SHIELD

## 2019-10-17 VITALS — BP 136/96 | Temp 98.5°F | Ht 77.0 in | Wt 221.6 lb

## 2019-10-17 DIAGNOSIS — I7300 Raynaud's syndrome without gangrene: Secondary | ICD-10-CM | POA: Diagnosis not present

## 2019-10-17 DIAGNOSIS — Z0000 Encounter for general adult medical examination without abnormal findings: Secondary | ICD-10-CM

## 2019-10-17 DIAGNOSIS — E559 Vitamin D deficiency, unspecified: Secondary | ICD-10-CM | POA: Diagnosis not present

## 2019-10-17 DIAGNOSIS — I209 Angina pectoris, unspecified: Secondary | ICD-10-CM

## 2019-10-17 DIAGNOSIS — Z0001 Encounter for general adult medical examination with abnormal findings: Secondary | ICD-10-CM

## 2019-10-17 DIAGNOSIS — M533 Sacrococcygeal disorders, not elsewhere classified: Secondary | ICD-10-CM | POA: Diagnosis not present

## 2019-10-17 DIAGNOSIS — R1013 Epigastric pain: Secondary | ICD-10-CM

## 2019-10-17 DIAGNOSIS — M4807 Spinal stenosis, lumbosacral region: Secondary | ICD-10-CM | POA: Diagnosis not present

## 2019-10-17 DIAGNOSIS — M47816 Spondylosis without myelopathy or radiculopathy, lumbar region: Secondary | ICD-10-CM | POA: Diagnosis not present

## 2019-10-17 MED ORDER — CYCLOBENZAPRINE HCL 10 MG PO TABS: 10.0000 mg | ORAL_TABLET | Freq: Three times a day (TID) | ORAL | 1 refills | Status: DC | PRN

## 2019-10-17 NOTE — Progress Notes (Signed)
Subjective:  Patient ID: Danny Snyder, male    DOB: Jul 20, 1966  Age: 53 y.o. MRN: [id number]  CC: Annual Exam (Went to [facility] last week for chest pain)   HPI Danny Snyder presents for annual physical as well as follow up from recent E.D. visit. He  Is also having chronic intermittent low back pain into the buttocks that is currently flaring.   E.D. record reviewed. Pt. Gives history that pain was lower substernal region with tingling in the jaw and left hand. He notes that it also seemed to go from the epigastrium around to the right mid back at the angle of the scapula. He has had 2 episodes. The first one that occurred was milder and went away after a few minutes. The second led to him going to the E.D.  Troponin's were negative. Work up considered low probability for ACS at that time. Pt. Had at that time reported that BP's were higher than normal for him at home. Today he mentions that 120-126/70-90 at home after keeping track as recommended by the E.D. No episode since E.D. visit 9 days ago. He has a history of GERD.   Patient in for follow-up of GERD. Currently  taking  PPI daily. There is no heartburn. No hematemesis and no melena. No dysphagia or choking. Onset is remote. Progression is stable. Complicating factors, none.Had some tingling in the left hand with above incidents. Wonders if the weather cooling off might be contributing to worsening of Renaud's dx. He is taking his nifedipine regularly.    Depression screen [facility] 2/9 10/17/2019 01/10/2019 11/26/2018  Decreased Interest 0 0 0  Down, Depressed, Hopeless 0 0 0  PHQ - 2 Score 0 0 0    History Sutton has a past medical history of Allergy, Anxiety, Arthritis, GERD (gastroesophageal reflux disease), and Raynaud's disease.   He has no past surgical history on file.   His family history includes Arthritis in his brother and mother; Cancer in his father; Diabetes in his father.He reports that he quit smoking about 17 years ago.  He has quit using smokeless tobacco.  His smokeless tobacco use included chew. He reports current alcohol use. He reports that he does not use drugs.    ROS Review of Systems  Constitutional: Negative for activity change, fatigue and unexpected weight change.  HENT: Negative for congestion, ear pain, hearing loss, postnasal drip and trouble swallowing.   Eyes: Negative for pain and visual disturbance.  Respiratory: Negative for cough, chest tightness and shortness of breath.   Cardiovascular: Positive for chest pain. Negative for palpitations and leg swelling.  Gastrointestinal: Negative for abdominal distention, abdominal pain, blood in stool, constipation, diarrhea, nausea and vomiting.  Endocrine: Negative for cold intolerance, heat intolerance and polydipsia.  Genitourinary: Negative for difficulty urinating, dysuria, flank pain, frequency and urgency.  Musculoskeletal: Positive for arthralgias and back pain. Negative for joint swelling.  Skin: Negative for color change, rash and wound.  Neurological: Negative for dizziness, syncope, speech difficulty, weakness, light-headedness, numbness and headaches.  Hematological: Does not bruise/bleed easily.  Psychiatric/Behavioral: Negative for confusion, decreased concentration, dysphoric mood and sleep disturbance. The patient is not nervous/anxious.     Objective:  BP (!) 136/96   Temp 98.5 F (36.9 C) (Temporal)   Ht 6\' 5"  (1.956 m)   Wt 221 lb 9.6 oz (100.5 kg)   SpO2 99%   BMI 26.28 kg/m   BP Readings from Last 3 Encounters:  10/17/19 (!) 136/96  10/08/19 Marland Kitchen)  137/100  01/10/19 119/76    Wt Readings from Last 3 Encounters:  10/17/19 221 lb 9.6 oz (100.5 kg)  01/10/19 235 lb (106.6 kg)  11/26/18 227 lb (103 kg)     Physical Exam Constitutional:      Appearance: He is well-developed.  HENT:     Head: Normocephalic and atraumatic.  Eyes:     Pupils: Pupils are equal, round, and reactive to light.  Neck:      Musculoskeletal: Normal range of motion.     Thyroid: No thyromegaly.     Trachea: No tracheal deviation.  Cardiovascular:     Rate and Rhythm: Normal rate and regular rhythm.     Heart sounds: Normal heart sounds. No murmur. No friction rub. No gallop.   Pulmonary:     Breath sounds: Normal breath sounds. No wheezing or rales.  Abdominal:     General: Bowel sounds are normal. There is no distension.     Palpations: Abdomen is soft. There is no mass.     Tenderness: There is no abdominal tenderness.     Hernia: There is no hernia in the left inguinal area.  Genitourinary:    Penis: Normal.      Scrotum/Testes: Normal.  Musculoskeletal: Normal range of motion.        General: Tenderness (Right SI area with palpable, tender spasm noted to the right superiorly. ) present.  Lymphadenopathy:     Cervical: No cervical adenopathy.  Skin:    General: Skin is warm and dry.  Neurological:     Mental Status: He is alert and oriented to person, place, and time.    XR - Some lumbar DJD Preliminary reading done by Randell Loop    Assessment & Plan:   Kron was seen today for annual exam.  Diagnoses and all orders for this visit:  SI (sacroiliac) pain -     CBC with Differential/Platelet -     DG Si Joints; Future -     DG Lumbar Spine 2-3 Views; Future  Angina pectoris (HCC) -     CBC with Differential/Platelet -     Ambulatory referral to Cardiology  Epigastric pain -     CBC with Differential/Platelet -     US Abdomen Complete; Future  Raynaud's disease without gangrene -     CBC with Differential/Platelet  Well adult exam -     CBC with Differential/Platelet -     Lipid panel -     PSA Total (Reflex To Free)  Vitamin D deficiency -     VITAMIN D 25 Hydroxy (Vit-D Deficiency, Fractures)  Other orders -     cyclobenzaprine (FLEXERIL) 10 MG tablet; Take 1 tablet (10 mg total) by mouth 3 (three) times daily as needed for muscle spasms.       I am having [name] "Alvester Chou Yeske" start on cyclobenzaprine. I am also having him maintain his aspirin, magnesium gluconate, fluticasone, NIFEdipine, Vitamin D (Ergocalciferol), diclofenac, solifenacin, tamsulosin, omeprazole, and Fexofenadine-Pseudoephedrine (ALLEGRA-D 24 HOUR PO).  Allergies as of 10/17/2019      Reactions   Lodine [etodolac] Other (See Comments)   Mouth sores       Medication List       Accurate as of October 17, 2019 11:59 PM. If you have any questions, ask your nurse or doctor.        ALLEGRA-D 24 HOUR PO   aspirin 81 MG tablet Take 81 mg by mouth daily.  cyclobenzaprine 10 MG tablet Commonly known as: FLEXERIL Take 1 tablet (10 mg total) by mouth 3 (three) times daily as needed for muscle spasms. Started by: Claretta Fraise, MD   diclofenac 75 MG EC tablet Commonly known as: VOLTAREN TAKE 1 TABLET 2 TIMES A DAY   fluticasone 50 MCG/ACT nasal spray Commonly known as: FLONASE Place 1 spray into both nostrils daily as needed for allergies or rhinitis.   magnesium gluconate 500 MG tablet Commonly known as: MAGONATE Take 500 mg by mouth 2 (two) times daily.   NIFEdipine 30 MG 24 hr tablet Commonly known as: ADALAT CC TAKE 1 TABLET (30 MG TOTAL) BY MOUTH DAILY.   omeprazole 40 MG capsule Commonly known as: PRILOSEC Take 1 capsule (40 mg total) by mouth 2 (two) times daily. TAKE 1 CAPSULE (40 MG TOTAL) BY MOUTH DAILY. Take at least two hours after last food intake. Do not eat for one hour after taking.   solifenacin 10 MG tablet Commonly known as: VESICARE TAKE 1 TABLET DAILY   tamsulosin 0.4 MG Caps capsule Commonly known as: FLOMAX TAKE 2 CAPSULES AT BEDTIME   Vitamin D (Ergocalciferol) 1.25 MG (50000 UT) Caps capsule Commonly known as: DRISDOL TAKE 1 CAPSULE ONCE A WEEK      Note that with the radiatio to the right angle of the scaapula, this could be gall bladder related as well. Abd [place] to rule out intrabdominal process was ordered. Continue to  monitor home BP will consider tx if it continues to climb.  Follow-up: Return in about 6 weeks (around 11/28/2019).  Claretta Fraise, M.D.

## 2019-10-18 LAB — LIPID PANEL
Chol/HDL Ratio: 4 ratio (ref 0.0–5.0)
Cholesterol, Total: 182 mg/dL (ref 100–199)
HDL: 45 mg/dL (ref >39)
LDL Chol Calc (NIH): 119 mg/dL — ABNORMAL HIGH (ref 0–99)
Triglycerides: 96 mg/dL (ref 0–149)
VLDL Cholesterol Cal: 18 mg/dL (ref 5–40)

## 2019-10-18 LAB — CBC WITH DIFFERENTIAL/PLATELET
Basophils Absolute: 0 10*3/uL (ref 0.0–0.2)
Basos: 0 %
EOS (ABSOLUTE): 0.4 10*3/uL (ref 0.0–0.4)
Eos: 7 %
Hematocrit: 40.6 % (ref 37.5–51.0)
Hemoglobin: 13.6 g/dL (ref 13.0–17.7)
Immature Grans (Abs): 0 10*3/uL (ref 0.0–0.1)
Immature Granulocytes: 0 %
Lymphocytes Absolute: 1.3 10*3/uL (ref 0.7–3.1)
Lymphs: 23 %
MCH: 28.2 pg (ref 26.6–33.0)
MCHC: 33.5 g/dL (ref 31.5–35.7)
MCV: 84 fL (ref 79–97)
Monocytes Absolute: 0.4 10*3/uL (ref 0.1–0.9)
Monocytes: 7 %
Neutrophils Absolute: 3.5 10*3/uL (ref 1.4–7.0)
Neutrophils: 63 %
Platelets: 186 10*3/uL (ref 150–450)
RBC: 4.82 x10E6/uL (ref 4.14–5.80)
RDW: 13.5 % (ref 11.6–15.4)
WBC: 5.5 10*3/uL (ref 3.4–10.8)

## 2019-10-18 LAB — VITAMIN D 25 HYDROXY (VIT D DEFICIENCY, FRACTURES): Vit D, 25-Hydroxy: 36.8 ng/mL (ref 30.0–100.0)

## 2019-10-18 LAB — PSA TOTAL (REFLEX TO FREE): Prostate Specific Ag, Serum: 0.7 ng/mL (ref 0.0–4.0)

## 2019-10-19 NOTE — Progress Notes (Signed)
Hello Catlin,  Your lab result is normal and/or stable.Some minor variations that are not significant are commonly marked abnormal, but do not represent any medical problem for you.  Best regards, Myrka Sylva, M.D.

## 2019-10-20 ENCOUNTER — Encounter: Payer: Self-pay | Admitting: Family Medicine

## 2019-10-21 ENCOUNTER — Other Ambulatory Visit: Payer: Self-pay | Admitting: Family Medicine

## 2019-10-22 ENCOUNTER — Telehealth: Payer: Self-pay | Admitting: Family Medicine

## 2019-10-25 ENCOUNTER — Other Ambulatory Visit: Payer: Self-pay

## 2019-10-25 ENCOUNTER — Ambulatory Visit (HOSPITAL_COMMUNITY)
Admission: RE | Admit: 2019-10-25 | Discharge: 2019-10-25 | Disposition: A | Discharge status: Home / Self Care | Payer: BLUE CROSS/BLUE SHIELD | Source: Ambulatory Visit | Attending: Family Medicine | Admitting: Family Medicine

## 2019-10-25 DIAGNOSIS — R1013 Epigastric pain: Secondary | ICD-10-CM | POA: Insufficient documentation

## 2019-11-25 ENCOUNTER — Other Ambulatory Visit: Payer: Self-pay | Admitting: Family Medicine

## 2019-11-26 ENCOUNTER — Encounter: Payer: Self-pay | Admitting: Cardiovascular Disease

## 2019-11-26 ENCOUNTER — Other Ambulatory Visit: Payer: Self-pay

## 2019-11-26 ENCOUNTER — Encounter: Payer: Self-pay | Admitting: Nurse Practitioner

## 2019-11-26 ENCOUNTER — Ambulatory Visit: Payer: BLUE CROSS/BLUE SHIELD | Admitting: Cardiovascular Disease

## 2019-11-26 ENCOUNTER — Encounter

## 2019-11-26 VITALS — BP 140/78 | HR 87 | Ht 77.0 in | Wt 224.4 lb

## 2019-11-26 DIAGNOSIS — I7300 Raynaud's syndrome without gangrene: Secondary | ICD-10-CM | POA: Diagnosis not present

## 2019-11-26 DIAGNOSIS — R079 Chest pain, unspecified: Secondary | ICD-10-CM | POA: Diagnosis not present

## 2019-11-26 DIAGNOSIS — I10 Essential (primary) hypertension: Secondary | ICD-10-CM

## 2019-11-26 NOTE — Patient Instructions (Signed)
Medication Instructions:  Your physician recommends that you continue on your current medications as directed. Please refer to the Current Medication list given to you today.  *If you need a refill on your cardiac medications before your next appointment, please call your pharmacy*  Lab Work: None Ordered    Testing/Procedures: Your physician has requested that you have an exercise tolerance test. For further information please visit [email or web address]. Please also follow instruction sheet, as given.    Follow-Up: At [facility], you and your health needs are our priority.  As part of our continuing mission to provide you with exceptional heart care, we have created designated Provider Care Teams.  These Care Teams include your primary Cardiologist (physician) and Advanced Practice Providers (APPs -  Physician Assistants and Nurse Practitioners) who all work together to provide you with the care you need, when you need it.  Your next appointment:    As Needed  The format for your next appointment:   Either In Person or Virtual  Provider:   You may see Dr. Acie Fredrickson or one of the following Advanced Practice Providers on your designated Care Team:    Richardson Dopp, PA-C  [place], [place]  Daune Perch, [place]

## 2019-11-26 NOTE — Progress Notes (Signed)
Cardiology Office Note:    Date:  11/26/2019   ID:  Danny Snyder, DOB 1966/02/07, MRN [id number]  PCP:  Claretta Fraise, MD  Cardiologist:  Macallister Ashmead  Electrophysiologist:  None   Referring MD: Claretta Fraise, MD   Chief Complaint  Patient presents with  . Chest Pain    History of Present Illness:    Danny Snyder is a 53 y.o. male with a hx of  Raynauds , and HTN.   We are asked by Dr. Livia Snellen to see him for further epidoses of chest discomfort  Several months of left sided chest pain . Radiates to between his shoulder / below his sholder blade.  Not related to exercise.  Has had some weak sensations - found his BP  to be low  Is very active.  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  Lasts for as long as 30-40 minutes.  Not related to eating, drinking , , not related to twisting his torso  His primary MD increased his omeprazole and the discomfort has decreased  BP log looks good.   BP has been well controlled.   No CP with exertion    Has had separate episode of lip tingling .  Went to the ER .   Past Medical History:  Diagnosis Date  . Allergy   . Anxiety   . Arthritis   . GERD (gastroesophageal reflux disease)   . Raynaud's disease     No past surgical history on file.  Current Medications: Current Meds  Medication Sig  . aspirin 81 MG tablet Take 81 mg by mouth daily.  . cyclobenzaprine (FLEXERIL) 10 MG tablet Take 1 tablet (10 mg total) by mouth 3 (three) times daily as needed for muscle spasms.  . diclofenac (VOLTAREN) 75 MG EC tablet TAKE 1 TABLET 2 TIMES A DAY  . Fexofenadine-Pseudoephedrine (ALLEGRA-D 24 HOUR PO)   . fluticasone (FLONASE) 50 MCG/ACT nasal spray Place 1 spray into both nostrils daily as needed for allergies or rhinitis.  . magnesium gluconate (MAGONATE) 500 MG tablet Take 500 mg by mouth 2 (two) times daily.  Marland Kitchen NIFEdipine (ADALAT CC) 30 MG 24 hr tablet TAKE 1 TABLET (30 MG TOTAL) BY MOUTH  DAILY.  Marland Kitchen omeprazole (PRILOSEC) 40 MG capsule Take 1 capsule (40 mg total) by mouth 2 (two) times daily. TAKE 1 CAPSULE (40 MG TOTAL) BY MOUTH DAILY. Take at least two hours after last food intake. Do not eat for one hour after taking.  . solifenacin (VESICARE) 10 MG tablet TAKE 1 TABLET DAILY  . tamsulosin (FLOMAX) 0.4 MG CAPS capsule TAKE 2 CAPSULES AT BEDTIME     Allergies:   Lodine [etodolac]   Social History   Socioeconomic History  . Marital status: Married    Spouse name: Not on file  . Number of children: Not on file  . Years of education: Not on file  . Highest education level: Not on file  Occupational History  . Not on file  Social Needs  . Financial resource strain: Not on file  . Food insecurity    Worry: Not on file    Inability: Not on file  . Transportation needs    Medical: Not on file    Non-medical: Not on file  Tobacco Use  . Smoking status: Former Smoker    Quit date: 2003    Years since quitting: 17.9  . Smokeless tobacco: Former  User    Types: Chew  Substance and Sexual Activity  . Alcohol use: Yes    Comment: once a week.  . Drug use: No  . Sexual activity: Yes  Lifestyle  . Physical activity    Days per week: Not on file    Minutes per session: Not on file  . Stress: Not on file  Relationships  . Social Herbalist on phone: Not on file    Gets together: Not on file    Attends religious service: Not on file    Active member of club or organization: Not on file    Attends meetings of clubs or organizations: Not on file    Relationship status: Not on file  Other Topics Concern  . Not on file  Social History Narrative  . Not on file     Family History: The patient's family history includes Arthritis in his brother and mother; Cancer in his father; Diabetes in his father.  ROS:   Please see the history of present illness.     All other systems reviewed and are negative.  EKGs/Labs/Other Studies Reviewed:    The following  studies were reviewed today:   EKG:    Recent Labs: 10/08/2019: ALT 33; BUN 14; Creatinine, Ser 1.00; Potassium 4.2; Sodium 141 10/17/2019: Hemoglobin 13.6; Platelets 186  Recent Lipid Panel    Component Value Date/Time   CHOL 182 10/17/2019 1018   TRIG 96 10/17/2019 1018   HDL 45 10/17/2019 1018   CHOLHDL 4.0 10/17/2019 1018   LDLCALC 119 (H) 10/17/2019 1018    Physical Exam:    VS:  BP 140/78   Pulse 87   Ht 6\' 5"  (1.956 m)   Wt 224 lb 6.4 oz (101.8 kg)   SpO2 99%   BMI 26.61 kg/m     Wt Readings from Last 3 Encounters:  11/26/19 224 lb 6.4 oz (101.8 kg)  10/17/19 221 lb 9.6 oz (100.5 kg)  01/10/19 235 lb (106.6 kg)     GEN:  Well nourished, well developed in no acute distress HEENT: Normal NECK: No JVD; No carotid bruits LYMPHATICS: No lymphadenopathy CARDIAC: RRR, no murmurs, rubs, gallops RESPIRATORY:  Clear to auscultation without rales, wheezing or rhonchi  ABDOMEN: Soft, non-tender, non-distended MUSCULOSKELETAL:  No edema; No deformity  SKIN: Warm and dry NEUROLOGIC:  Alert and oriented x 3 PSYCHIATRIC:  Normal affect   ASSESSMENT:    1. Chest pain of uncertain etiology   2. Essential hypertension   3. Raynaud's phenomenon without gangrene    PLAN:    In order of problems listed above:  1. Chest pain :   Likely due to GERD .  He has had several episodes of chest discomfort with radiation to his intrascapular region.  There are some characteristics that are somewhat concerning for unstable angina.  I think that we should order a treadmill test for further evaluation.  We also discussed possibly doing a coronary calcium score.  He would like to hold off on that for now.  He is cholesterol levels are fairly well controlled.  2.  Hypertension: He is never been diagnosed with hypertension but is on nifedipine for Raynaud's phenomenon.  His blood pressures are in the normal/mildly elevated range so I suspect he does indeed have some mild hypertension.  I  have advised him to watch his salt intake and to watch his general diet.  3.  Raynaud's phenomenon: He has been taking his nifedipine every other  day because he has occasional episodes of lightheadedness.  I suggested that if these continue, we discontinue the nifedipine and start him on amlodipine either 2.5 mg a day or 5 mg a day.  I think this will be better tolerated.      Medication Adjustments/Labs and Tests Ordered: Current medicines are reviewed at length with the patient today.  Concerns regarding medicines are outlined above.  Orders Placed This Encounter  Procedures  . Exercise Tolerance Test   No orders of the defined types were placed in this encounter.   Patient Instructions  Medication Instructions:  Your physician recommends that you continue on your current medications as directed. Please refer to the Current Medication list given to you today.  *If you need a refill on your cardiac medications before your next appointment, please call your pharmacy*  Lab Work: None Ordered    Testing/Procedures: Your physician has requested that you have an exercise tolerance test. For further information please visit [email or web address]. Please also follow instruction sheet, as given.    Follow-Up: At [facility], you and your health needs are our priority.  As part of our continuing mission to provide you with exceptional heart care, we have created designated Provider Care Teams.  These Care Teams include your primary Cardiologist (physician) and Advanced Practice Providers (APPs -  Physician Assistants and Nurse Practitioners) who all work together to provide you with the care you need, when you need it.  Your next appointment:    As Needed  The format for your next appointment:   Either In Person or Virtual  Provider:   You may see Dr. Acie Fredrickson or one of the following Advanced Practice Providers on your designated Care Team:    Richardson Dopp, PA-C  [place],  [place]  Daune Perch, [place]       Signed, Mertie Moores, MD  11/26/2019 5:39 PM    [place]

## 2019-12-03 ENCOUNTER — Ambulatory Visit: Payer: BLUE CROSS/BLUE SHIELD | Admitting: Family Medicine

## 2019-12-26 ENCOUNTER — Telehealth (HOSPITAL_COMMUNITY): Payer: Self-pay

## 2019-12-26 NOTE — Telephone Encounter (Signed)
Encounter complete. 

## 2019-12-27 ENCOUNTER — Other Ambulatory Visit (HOSPITAL_COMMUNITY)
Admission: RE | Admit: 2019-12-27 | Discharge: 2019-12-27 | Disposition: A | Discharge status: Home / Self Care | Payer: BC Managed Care – PPO | Source: Ambulatory Visit | Attending: Cardiovascular Disease | Admitting: Cardiovascular Disease

## 2019-12-27 DIAGNOSIS — Z20822 Contact with and (suspected) exposure to covid-19: Secondary | ICD-10-CM | POA: Diagnosis not present

## 2019-12-28 LAB — NOVEL CORONAVIRUS, NAA (HOSP ORDER, SEND-OUT TO REF LAB; TAT 18-24 HRS): SARS-CoV-2, NAA: NOT DETECTED

## 2019-12-31 ENCOUNTER — Other Ambulatory Visit: Payer: Self-pay

## 2019-12-31 ENCOUNTER — Ambulatory Visit (HOSPITAL_COMMUNITY)
Admission: RE | Admit: 2019-12-31 | Discharge: 2019-12-31 | Disposition: A | Discharge status: Home / Self Care | Payer: BC Managed Care – PPO | Source: Ambulatory Visit | Attending: Cardiovascular Disease | Admitting: Cardiovascular Disease

## 2019-12-31 DIAGNOSIS — R079 Chest pain, unspecified: Secondary | ICD-10-CM

## 2019-12-31 DIAGNOSIS — I7300 Raynaud's syndrome without gangrene: Secondary | ICD-10-CM | POA: Diagnosis not present

## 2019-12-31 LAB — EXERCISE TOLERANCE TEST
Estimated workload: 9.5 METS
Exercise duration (min): 7 min
Exercise duration (sec): 38 s
MPHR: 168 {beats}/min
Peak HR: 146 {beats}/min
Percent HR: 87 %
RPE: 17
Rest HR: 74 {beats}/min

## 2020-01-08 ENCOUNTER — Other Ambulatory Visit: Payer: Self-pay

## 2020-01-08 ENCOUNTER — Ambulatory Visit: Payer: BC Managed Care – PPO | Admitting: Family Medicine

## 2020-01-08 ENCOUNTER — Encounter: Payer: Self-pay | Admitting: Family Medicine

## 2020-01-08 VITALS — BP 131/85 | HR 93 | Temp 97.2°F | Ht 77.0 in | Wt 221.8 lb

## 2020-01-08 DIAGNOSIS — J0100 Acute maxillary sinusitis, unspecified: Secondary | ICD-10-CM

## 2020-01-08 DIAGNOSIS — I7300 Raynaud's syndrome without gangrene: Secondary | ICD-10-CM | POA: Diagnosis not present

## 2020-01-08 DIAGNOSIS — J3089 Other allergic rhinitis: Secondary | ICD-10-CM | POA: Diagnosis not present

## 2020-01-08 DIAGNOSIS — K2100 Gastro-esophageal reflux disease with esophagitis, without bleeding: Secondary | ICD-10-CM

## 2020-01-08 DIAGNOSIS — I952 Hypotension due to drugs: Secondary | ICD-10-CM

## 2020-01-08 MED ORDER — BETAMETHASONE SOD PHOS & ACET 6 (3-3) MG/ML IJ SUSP
6.0000 mg | Freq: Once | INTRAMUSCULAR | Status: AC
  Administered 2020-01-08: 09:00:00 6 mg via INTRAMUSCULAR

## 2020-01-08 MED ORDER — AMLODIPINE BESYLATE 2.5 MG PO TABS: 2.5000 mg | ORAL_TABLET | Freq: Every day | ORAL | 2 refills | Status: DC

## 2020-01-08 MED ORDER — MOMETASONE FUROATE 50 MCG/ACT NA SUSP
2.0000 | Freq: Every day | NASAL | 12 refills | Status: AC
Start: 2020-01-08 — End: ?

## 2020-01-08 MED ORDER — ESOMEPRAZOLE MAGNESIUM 40 MG PO CPDR: 40.0000 mg | DELAYED_RELEASE_CAPSULE | Freq: Two times a day (BID) | ORAL | 5 refills | Status: DC

## 2020-01-08 MED ORDER — CEFUROXIME AXETIL 250 MG PO TABS: 250.0000 mg | ORAL_TABLET | Freq: Two times a day (BID) | ORAL | 0 refills | Status: AC

## 2020-01-08 NOTE — Progress Notes (Signed)
Subjective:  Patient ID: Danny Snyder, male    DOB: 12/18/1966  Age: 54 y.o. MRN: [id number]  CC: Medical Management of Chronic Issues (6 week follow up ) and Allergies   HPI Danny Snyder presents for lack of relief from substernal chest pain. Nml stress test with cards.  We had increased his omeprazole to twice daily.  We discussed that it should be taken on an empty stomach.  He has been taking on an empty stomach but not waking after affords to eat.  He has been eating breakfast right after taking the morning dose.  The pain is moderately severe it is occurring almost every day.  It has been going on for 6 to 8 months.  Its not changing.  It is unpredictable when it is going to occur, but seems to be happening more often.  He points to the area just above the xiphoid is the point of maximal pain.  It radiates up his sternal area into the throat and even into the sinuses.  He notes that it also radiates towards the right upper quadrant along the costal margin.  He is avoiding eating 2 hours before bedtime to help but still wakes up many nights having to take multiple Gas-X and Gaviscon pills.  Gaviscon does seems to help.  Patient also having a lot of allergy symptoms.  Patient has allergic rhinitis symptoms including sneezing frequently sniffling, clear rhinorrhea, watery and itchy eyes. There has been no fever no chills no sweats. No earaches. There is some scratchy throat but no sore throat or difficulty swallowing. There is some nasal congestion.  Patient is also recently having more sinus pressure and blowing Yellowish-green purulent nasal discharge.   Had hypotension and had to DC nifedipine.  This was causing him to be weak and dizzy.  Unfortunately since that time his Raynaud's getting worse.  He is having more redness and pain in his fingers and hands.  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  When she saw the cardiologist in follow-up from the stress test cardiology  recommended a medication that might be more satisfactory.  Review of that note was performed.  Amlodipine was suggested and will be tried. Depression screen Danny Snyder 2/9 01/08/2020 10/17/2019 01/10/2019  Decreased Interest 0 0 0  Down, Depressed, Hopeless 0 0 0  PHQ - 2 Score 0 0 0    History Danny Snyder has a past medical history of Allergy, Anxiety, Arthritis, GERD (gastroesophageal reflux disease), and Raynaud's disease.   He has no past surgical history on file.   His family history includes Arthritis in his brother and mother; Cancer in his father; Diabetes in his father.He reports that he quit smoking about 18 years ago. He has quit using smokeless tobacco.  His smokeless tobacco use included chew. He reports current alcohol use. He reports that he does not use drugs.    ROS Review of Systems  Constitutional: Negative.   HENT: Negative.   Eyes: Negative for visual disturbance.  Respiratory: Negative for cough and shortness of breath.   Cardiovascular: Negative for chest pain and leg swelling.  Gastrointestinal: Positive for abdominal pain. Negative for blood in stool, constipation, diarrhea, nausea and vomiting.  Genitourinary: Negative for difficulty urinating.  Musculoskeletal: Positive for arthralgias, joint swelling (Hands) and myalgias.  Skin: Negative for rash.  Neurological: Negative for headaches.  Psychiatric/Behavioral: Negative for sleep disturbance.    Objective:  BP 131/85   Pulse 93  Temp (!) 97.2 F (36.2 C) (Temporal)   Ht 6\' 5"  (1.956 m)   Wt 221 lb 12.8 oz (100.6 kg)   SpO2 99%   BMI 26.30 kg/m   BP Readings from Last 3 Encounters:  01/08/20 131/85  11/26/19 140/78  10/17/19 (!) 136/96    Wt Readings from Last 3 Encounters:  01/08/20 221 lb 12.8 oz (100.6 kg)  11/26/19 224 lb 6.4 oz (101.8 kg)  10/17/19 221 lb 9.6 oz (100.5 kg)     Physical Exam Constitutional:      General: He is not in acute distress.    Appearance: He is well-developed.  HENT:      Head: Normocephalic and atraumatic.     Right Ear: External ear normal.     Left Ear: External ear normal.     Nose: Nose normal.  Eyes:     Conjunctiva/sclera: Conjunctivae normal.     Pupils: Pupils are equal, round, and reactive to light.  Cardiovascular:     Rate and Rhythm: Normal rate and regular rhythm.     Heart sounds: Normal heart sounds. No murmur.  Pulmonary:     Effort: Pulmonary effort is normal. No respiratory distress.     Breath sounds: Normal breath sounds. No wheezing or rales.  Abdominal:     Palpations: Abdomen is soft.     Tenderness: There is no abdominal tenderness.  Musculoskeletal:        General: Normal range of motion.     Cervical back: Normal range of motion and neck supple.  Skin:    General: Skin is warm and dry.  Neurological:     Mental Status: He is alert and oriented to person, place, and time.     Deep Tendon Reflexes: Reflexes are normal and symmetric.  Psychiatric:        Behavior: Behavior normal.        Thought Content: Thought content normal.        Judgment: Judgment normal.       Assessment & Plan:   Danny Snyder was seen today for medical management of chronic issues and allergies.  Diagnoses and all orders for this visit:  Acute maxillary sinusitis, recurrence not specified -     cefUROXime (CEFTIN) 250 MG tablet; Take 1 tablet (250 mg total) by mouth 2 (two) times daily with a meal for 10 days. -     betamethasone acetate-betamethasone sodium phosphate (CELESTONE) injection 6 mg  Non-seasonal allergic rhinitis, unspecified trigger -     mometasone (NASONEX) 50 MCG/ACT nasal spray; Place 2 sprays into the nose daily. -     betamethasone acetate-betamethasone sodium phosphate (CELESTONE) injection 6 mg  Gastroesophageal reflux disease with esophagitis without hemorrhage -     Ambulatory referral to Gastroenterology -     esomeprazole (NEXIUM) 40 MG capsule; Take 1 capsule (40 mg total) by mouth 2 (two) times daily before a  meal. Wait one hour before eating  Raynaud's disease without gangrene -     amLODipine (NORVASC) 2.5 MG tablet; Take 1 tablet (2.5 mg total) by mouth daily. For blood pressure  Hypotension due to drugs       I have discontinued [name] "Alvester Chou Corredor"'s fluticasone, NIFEdipine, omeprazole, cyclobenzaprine, diclofenac, and solifenacin. I am also having him start on cefUROXime, esomeprazole, mometasone, and amLODipine. Additionally, I am having him maintain his aspirin, magnesium gluconate, Fexofenadine-Pseudoephedrine (ALLEGRA-D 24 HOUR PO), and tamsulosin. We administered betamethasone acetate-betamethasone sodium phosphate.  Allergies as of 01/08/2020  Reactions   Lodine [etodolac] Other (See Comments)   Mouth sores       Medication List       Accurate as of January 08, 2020 12:35 PM. If you have any questions, ask your nurse or doctor.        STOP taking these medications   cyclobenzaprine 10 MG tablet Commonly known as: FLEXERIL Stopped by: Claretta Fraise, MD   diclofenac 75 MG EC tablet Commonly known as: VOLTAREN Stopped by: Claretta Fraise, MD   fluticasone 50 MCG/ACT nasal spray Commonly known as: FLONASE Stopped by: Claretta Fraise, MD   NIFEdipine 30 MG 24 hr tablet Commonly known as: ADALAT CC Stopped by: Claretta Fraise, MD   omeprazole 40 MG capsule Commonly known as: PRILOSEC Stopped by: Claretta Fraise, MD   solifenacin 10 MG tablet Commonly known as: VESICARE Stopped by: Claretta Fraise, MD     TAKE these medications   ALLEGRA-D 24 HOUR PO   amLODipine 2.5 MG tablet Commonly known as: NORVASC Take 1 tablet (2.5 mg total) by mouth daily. For blood pressure Started by: Claretta Fraise, MD   aspirin 81 MG tablet Take 81 mg by mouth daily.   cefUROXime 250 MG tablet Commonly known as: CEFTIN Take 1 tablet (250 mg total) by mouth 2 (two) times daily with a meal for 10 days. Started by: Claretta Fraise, MD   esomeprazole 40 MG  capsule Commonly known as: NexIUM Take 1 capsule (40 mg total) by mouth 2 (two) times daily before a meal. Wait one hour before eating Started by: Claretta Fraise, MD   magnesium gluconate 500 MG tablet Commonly known as: MAGONATE Take 500 mg by mouth 2 (two) times daily.   mometasone 50 MCG/ACT nasal spray Commonly known as: Nasonex Place 2 sprays into the nose daily. Started by: Claretta Fraise, MD   tamsulosin 0.4 MG Caps capsule Commonly known as: FLOMAX TAKE 2 CAPSULES AT BEDTIME      Patient has significant unexplained substernal chest pain radiating to the right and to the throat.  This has all the earmarked of reflux esophagitis.  Unfortunately it has not responded to treatment as expected.  As a result I have reemphasized the need to have an empty stomach and to keep it empty for at least an hour after each dose.  And switching him over to Nexium to have a little bit more potency in the form of his PPI.  He also should see a GI doctor for endoscopy.  That consult has been ordered.  Additionally I took him off of the Vesicare to see if that helps somewhat with his GI situation.  Patient also has some chronic allergic rhinitis.  We discussed treatment for that.  For now Celestone injection should help calm down the allergy and sinus congestion while the green discharge indicate some sinus infection and that will be treated with cefuroxime for 10 days.  If he calls in stating that the congestion has not cleared I can remove that up to 30 days total.  His Raynaud's unfortunately is worsening now that he can no longer take the nifedipine safely.  After reviewing cardiology's note I am going give a try to the amlodipine.  Due to his problems with dizziness and hypotension I want to start at the lowest dose possible at 2.5 mg daily.  He will let me know if that causes either hypotension or dizziness or if it just does not help with the Raynaud's at all.  Follow-up: Return in about  6 weeks  (around 02/19/2020).  Claretta Fraise, M.D.

## 2020-01-13 ENCOUNTER — Other Ambulatory Visit: Payer: Self-pay | Admitting: Family Medicine

## 2020-01-13 ENCOUNTER — Telehealth: Payer: Self-pay | Admitting: *Deleted

## 2020-01-13 MED ORDER — PANTOPRAZOLE SODIUM 40 MG PO TBEC: 40.0000 mg | DELAYED_RELEASE_TABLET | Freq: Two times a day (BID) | ORAL | 2 refills | Status: DC

## 2020-01-13 NOTE — Telephone Encounter (Signed)
I sent in pantoprazole.

## 2020-01-13 NOTE — Telephone Encounter (Signed)
Nexium is not covered by the pt's insurance. No alternatives were given.

## 2020-01-20 ENCOUNTER — Other Ambulatory Visit: Payer: Self-pay | Admitting: Family Medicine

## 2020-01-21 ENCOUNTER — Ambulatory Visit: Payer: BC Managed Care – PPO | Admitting: Physician Assistant

## 2020-01-21 ENCOUNTER — Other Ambulatory Visit: Payer: Self-pay

## 2020-01-21 ENCOUNTER — Encounter: Payer: Self-pay | Admitting: Physician Assistant

## 2020-01-21 VITALS — BP 140/80 | HR 80 | Temp 97.9°F | Ht 77.0 in | Wt 217.8 lb

## 2020-01-21 DIAGNOSIS — K219 Gastro-esophageal reflux disease without esophagitis: Secondary | ICD-10-CM | POA: Diagnosis not present

## 2020-01-21 DIAGNOSIS — R0789 Other chest pain: Secondary | ICD-10-CM

## 2020-01-21 DIAGNOSIS — Z01818 Encounter for other preprocedural examination: Secondary | ICD-10-CM

## 2020-01-21 DIAGNOSIS — K5900 Constipation, unspecified: Secondary | ICD-10-CM | POA: Diagnosis not present

## 2020-01-21 MED ORDER — HYOSCYAMINE SULFATE 0.125 MG SL SUBL: 0.1250 mg | SUBLINGUAL_TABLET | Freq: Four times a day (QID) | SUBLINGUAL | 2 refills | Status: DC | PRN

## 2020-01-21 MED ORDER — NA SULFATE-K SULFATE-MG SULF 17.5-3.13-1.6 GM/177ML PO SOLN: 1.0000 | Freq: Once | ORAL | 0 refills | Status: AC

## 2020-01-21 NOTE — Patient Instructions (Addendum)
If you are age 53 or older, your body mass index should be between 23-30. Your Body mass index is 25.83 kg/m. If this is out of the aforementioned range listed, please consider follow up with your Primary Care Provider.  If you are age 81 or younger, your body mass index should be between 19-25. Your Body mass index is 25.83 kg/m. If this is out of the aformentioned range listed, please consider follow up with your Primary Care Provider.  You have been scheduled for an endoscopy and colonoscopy. Please follow the written instructions given to you at your visit today. Please pick up your prep supplies at the pharmacy within the next 1-3 days. If you use inhalers (even only as needed), please bring them with you on the day of your procedure.  Continue using protonix 40mg  twice a day Use Miralax 17 gms in 8oz water daily  We have sent the following medications to your pharmacy for you to pick up at your convenience: Levsin Suprep  Due to recent changes in healthcare laws, you may see the results of your imaging and laboratory studies on MyChart before your provider has had a chance to review them.  We understand that in some cases there may be results that are confusing or concerning to you. Not all laboratory results come back in the same time frame and the provider may be waiting for multiple results in order to interpret others.  Please give [place] 48 hours in order for your provider to thoroughly review all the results before contacting the office for clarification of your results.

## 2020-01-21 NOTE — Progress Notes (Signed)
Subjective:    Patient ID: Danny Snyder, male    DOB: 16-Sep-1966, 54 y.o.   MRN: [id number]  HPI Danny Snyder is a pleasant 54 year old white male, known to Dr. Hilarie Fredrickson from prior colonoscopy who is referred today by Dr. Claretta Fraise for evaluation of noncardiac chest pain. Patient says his current symptoms started over the past 3 to 4 months.  He began developing random episodes of lower substernal chest pain which she said would last for up to 30 minutes and be quite painful and sometimes radiate into the right scapular area.  These episodes were not associated with eating or activity.  They are not associated with nausea or vomiting.  He has also had 3 or 4 episodes of what he describes as waking up in the middle of the night with "being on fire" with burning up into his throat.  He attributes this to sinus drainage and reflux but says this does not cause the chest pain. He has since stopped eating after 8 PM and says if he has any nighttime symptoms he will get up and sleep in the recliner.  He has had been taking Gas-X on a as needed basis.  He had been on omeprazole twice daily which was started several months ago and then switched to Protonix 40 mg twice daily about a week ago. He had been referred to cardiology and underwent work-up per Dr. Acie Fredrickson with stress testing which was negative.  He was told his chest pain was noncardiac. He denies any dysphagia or odynophagia and has not had chronic problems with GERD in the past. No prior EGD. Last colonoscopy was done in March 2018 with removal of a 10 mm polyp from the ascending colon which was a sessile serrated adenoma and he is indicated for 3-year interval follow-up.  Also noted to have multiple diverticuli and small internal hemorrhoids.  Patient also mentions he has had problems with constipation and may go for 3 to 4 days without a bowel movement.  He has been taking a fiber supplement and taking at 1000 mg of magnesium twice daily to help with  his bowels.  He also has occasional episodes of a complete bowel evacuation with multiple bowel movements which would generally be associated with nausea.  Review of Systems Pertinent positive and negative review of systems were noted in the above HPI section.  All other review of systems was otherwise negative.  Outpatient Encounter Medications as of 01/21/2020  Medication Sig  . amLODipine (NORVASC) 2.5 MG tablet Take 1 tablet (2.5 mg total) by mouth daily. For blood pressure  . aspirin 81 MG tablet Take 81 mg by mouth daily.  Marland Kitchen Fexofenadine-Pseudoephedrine (ALLEGRA-D 24 HOUR PO)   . magnesium gluconate (MAGONATE) 500 MG tablet Take 500 mg by mouth 2 (two) times daily.  . mometasone (NASONEX) 50 MCG/ACT nasal spray Place 2 sprays into the nose daily.  . pantoprazole (PROTONIX) 40 MG tablet Take 1 tablet (40 mg total) by mouth 2 (two) times daily. For stomach  . psyllium (METAMUCIL) 58.6 % packet Take 1 packet by mouth 2 (two) times daily.  . tamsulosin (FLOMAX) 0.4 MG CAPS capsule TAKE 2 CAPSULES AT BEDTIME  . hyoscyamine (LEVSIN SL) 0.125 MG SL tablet Place 1 tablet (0.125 mg total) under the tongue every 6 (six) hours as needed.  . Na Sulfate-K Sulfate-Mg Sulf 17.5-3.13-1.6 GM/177ML SOLN Take 1 kit by mouth once for 1 dose.   No facility-administered encounter medications on file as of 01/21/2020.  Allergies  Allergen Reactions  . Lodine [Etodolac] Other (See Comments)    Mouth sores    Patient Active Problem List   Diagnosis Date Noted  . Gastroesophageal reflux disease with esophagitis 04/17/2019  . Primary osteoarthritis involving multiple joints 08/29/2018  . Bladder spasms 08/29/2018  . Thumb pain 02/12/2015  . Raynaud's syndrome 02/12/2015   Social History   Socioeconomic History  . Marital status: Married    Spouse name: Not on file  . Number of children: Not on file  . Years of education: Not on file  . Highest education level: Not on file  Occupational History  .  Not on file  Tobacco Use  . Smoking status: Former Smoker    Quit date: 2003    Years since quitting: 18.1  . Smokeless tobacco: Former Systems developer    Types: Chew  Substance and Sexual Activity  . Alcohol use: Yes    Comment: once a week.  . Drug use: No  . Sexual activity: Yes  Other Topics Concern  . Not on file  Social History Narrative  . Not on file   Social Determinants of Health   Financial Resource Strain:   . Difficulty of Paying Living Expenses: Not on file  Food Insecurity:   . Worried About Charity fundraiser in the Last Year: Not on file  . Ran Out of Food in the Last Year: Not on file  Transportation Needs:   . Lack of Transportation (Medical): Not on file  . Lack of Transportation (Non-Medical): Not on file  Physical Activity:   . Days of Exercise per Week: Not on file  . Minutes of Exercise per Session: Not on file  Stress:   . Feeling of Stress : Not on file  Social Connections:   . Frequency of Communication with Friends and Family: Not on file  . Frequency of Social Gatherings with Friends and Family: Not on file  . Attends Religious Services: Not on file  . Active Member of Clubs or Organizations: Not on file  . Attends Archivist Meetings: Not on file  . Marital Status: Not on file  Intimate Partner Violence:   . Fear of Current or Ex-Partner: Not on file  . Emotionally Abused: Not on file  . Physically Abused: Not on file  . Sexually Abused: Not on file    Danny Snyder's family history includes Arthritis in his brother and mother; Cancer in his father; Diabetes in his father.      Objective:    Vitals:   01/21/20 1536  BP: 140/80  Pulse: 80  Temp: 97.9 F (36.6 C)    Physical Exam Well-developed well-nourished white male male in no acute distress.  Height, Weight, 217 BMI 25.8  HEENT; nontraumatic normocephalic, EOMI, PER R LA, sclera anicteric. Oropharynx; not examined Neck; supple, no JVD Cardiovascular; regular rate and  rhythm with S1-S2, no murmur rub or gallop no chest wall tenderness, very mild tenderness to palpation of the xiphoid process Pulmonary; Clear bilaterally Abdomen; soft, nontender, nondistended, no palpable mass or hepatosplenomegaly, bowel sounds are active Rectal; not done Skin; benign exam, no jaundice rash or appreciable lesions Extremities; no clubbing cyanosis or edema skin warm and dry Neuro/Psych; alert and oriented x4, grossly nonfocal mood and affect appropriate       Assessment & Plan:   #46 54 year old white male with 25-monthhistory of intermittent lower substernal chest pain, noncardiac with recent negative cardiac evaluation. Negative upper abdominal ultrasound October 2020.  Rule out secondary to GERD, esophageal spasm.  #2 history of sessile serrated adenoma-due for follow-up colonoscopy March 2021 #3 diverticulosis 4.  Internal hemorrhoids small 5.  Constipation, with occasional episodes of complete bowel evacuation with multiple bowel movements and associated nausea  Plan; patient will be scheduled for EGD and colonoscopy with Dr. Hilarie Fredrickson in March 2021.  Both procedures were discussed in detail with patient including indications risks and benefits and he is agreeable to proceed. Reviewed an antireflux regimen, including elevation of the back 45 degrees while sleeping, n.p.o. for 2 to 3 hours prior to bedtime. Continue pantoprazole 40 mg p.o. twice daily Trial of Levsin sublingual every 4 to 6 hours as needed for episodes of chest pain. Start Metamucil 17 g in 8 ounces of water daily and discontinue magnesium supplement. Further plans pending findings at EGD and colonoscopy.  Ameila Weldon S Meriam Chojnowski PA-C 01/21/2020   Cc: Claretta Fraise, MD

## 2020-01-22 ENCOUNTER — Telehealth: Payer: Self-pay | Admitting: Family Medicine

## 2020-01-22 ENCOUNTER — Other Ambulatory Visit: Payer: Self-pay | Admitting: Family Medicine

## 2020-01-22 MED ORDER — DICLOFENAC SODIUM 75 MG PO TBEC: 75.0000 mg | DELAYED_RELEASE_TABLET | Freq: Two times a day (BID) | ORAL | 2 refills | Status: DC

## 2020-01-22 NOTE — Telephone Encounter (Signed)
What is the name of the medication? diclofenac (VOLTAREN) 75 MG EC tablet    Have you contacted your pharmacy to request a refill? yes  Which pharmacy would you like this sent to? [facility], pt is still taking this medication, pt was last seen on 01/08/2020 by Dr. Livia Snellen     Patient notified that their request is being sent to the clinical staff for review and that they should receive a call once it is complete. If they do not receive a call within 24 hours they can check with their pharmacy or our office.

## 2020-01-22 NOTE — Telephone Encounter (Signed)
Not on current med list.

## 2020-01-22 NOTE — Telephone Encounter (Signed)
I sent in the requested prescription 

## 2020-02-04 NOTE — Progress Notes (Signed)
Addendum: Reviewed and agree with assessment and management plan. Camren Lipsett M, MD  

## 2020-02-24 ENCOUNTER — Other Ambulatory Visit: Payer: Self-pay | Admitting: Family Medicine

## 2020-03-06 ENCOUNTER — Encounter: Payer: Self-pay | Admitting: Internal Medicine

## 2020-03-06 ENCOUNTER — Other Ambulatory Visit: Payer: Self-pay

## 2020-03-09 ENCOUNTER — Telehealth: Payer: Self-pay | Admitting: Internal Medicine

## 2020-03-09 NOTE — Telephone Encounter (Signed)
Pt states he had a stomach bug and forgot to get his covid test Friday. He cancelled his ECL scheduled for tomorrow. States since starting the medications that Amy gave him along with miralax he has been feeling much better and not having issues anymore. He wonders if he really still needs the EGD. He states insurance is not the best but if she really feels he needs it he will reschedule both procedures. Please advise.

## 2020-03-10 ENCOUNTER — Encounter: Payer: BC Managed Care – PPO | Admitting: Internal Medicine

## 2020-03-10 NOTE — Telephone Encounter (Signed)
He needs to reschedule the colonoscopy, I think okay to cancel the EGD if his symptoms have all resolved.  Symptoms were consistent with GERD, I would like him to continue the medications we started at his office visit

## 2020-03-10 NOTE — Telephone Encounter (Signed)
Pt notified via mychart

## 2020-03-19 DIAGNOSIS — Z23 Encounter for immunization: Secondary | ICD-10-CM | POA: Diagnosis not present

## 2020-04-03 ENCOUNTER — Other Ambulatory Visit: Payer: Self-pay | Admitting: Family Medicine

## 2020-04-03 DIAGNOSIS — I7300 Raynaud's syndrome without gangrene: Secondary | ICD-10-CM

## 2020-04-13 ENCOUNTER — Other Ambulatory Visit: Payer: Self-pay

## 2020-04-13 ENCOUNTER — Other Ambulatory Visit: Payer: Self-pay | Admitting: Internal Medicine

## 2020-04-13 ENCOUNTER — Ambulatory Visit (INDEPENDENT_AMBULATORY_CARE_PROVIDER_SITE_OTHER): Payer: BC Managed Care – PPO

## 2020-04-13 DIAGNOSIS — Z1159 Encounter for screening for other viral diseases: Secondary | ICD-10-CM | POA: Diagnosis not present

## 2020-04-14 LAB — SARS CORONAVIRUS 2 (TAT 6-24 HRS): SARS Coronavirus 2: NEGATIVE

## 2020-04-15 ENCOUNTER — Ambulatory Visit (AMBULATORY_SURGERY_CENTER): Payer: BC Managed Care – PPO | Admitting: Internal Medicine

## 2020-04-15 ENCOUNTER — Encounter: Payer: Self-pay | Admitting: Internal Medicine

## 2020-04-15 ENCOUNTER — Other Ambulatory Visit: Payer: Self-pay

## 2020-04-15 VITALS — BP 122/78 | HR 64 | Temp 96.6°F | Resp 15 | Ht 77.0 in | Wt 217.0 lb

## 2020-04-15 DIAGNOSIS — K5900 Constipation, unspecified: Secondary | ICD-10-CM | POA: Diagnosis not present

## 2020-04-15 DIAGNOSIS — Z86010 Personal history of colonic polyps: Secondary | ICD-10-CM

## 2020-04-15 MED ORDER — SODIUM CHLORIDE 0.9 % IV SOLN: 500.0000 mL | Freq: Once | INTRAVENOUS | Status: DC

## 2020-04-15 NOTE — Progress Notes (Signed)
Temp-JB VS-DT 

## 2020-04-15 NOTE — Op Note (Signed)
[facility] Patient Name: Danny Snyder Procedure Date: 04/15/2020 1:27 PM MRN: [id number] Endoscopist: Jerene Bears , MD Age: 54 Referring MD:  Date of Birth: 1966/05/04 Gender: Male Account #: [id number] Procedure:                Colonoscopy Indications:              High risk colon cancer surveillance: Personal                            history of sessile serrated colon polyp (10 mm or                            greater in size), Last colonoscopy 3 years ago Medicines:                Monitored Anesthesia Care Procedure:                Pre-Anesthesia Assessment:                           - Prior to the procedure, a History and Physical                            was performed, and patient medications and                            allergies were reviewed. The patient's tolerance of                            previous anesthesia was also reviewed. The risks                            and benefits of the procedure and the sedation                            options and risks were discussed with the patient.                            All questions were answered, and informed consent                            was obtained. Prior Anticoagulants: The patient has                            taken no previous anticoagulant or antiplatelet                            agents. ASA Grade Assessment: II - A patient with                            mild systemic disease. After reviewing the risks                            and benefits, the patient was deemed in  satisfactory condition to undergo the procedure.                           After obtaining informed consent, the colonoscope                            was passed under direct vision. Throughout the                            procedure, the patient's blood pressure, pulse, and                            oxygen saturations were monitored continuously. The                            Colonoscope was  introduced through the anus and                            advanced to the cecum, identified by appendiceal                            orifice and ileocecal valve. The colonoscopy was                            performed without difficulty. The patient tolerated                            the procedure well. The quality of the bowel                            preparation was good. The ileocecal valve,                            appendiceal orifice, and rectum were photographed. Scope In: 1:33:21 PM Scope Out: 1:47:26 PM Scope Withdrawal Time: 0 hours 11 minutes 30 seconds  Total Procedure Duration: 0 hours 14 minutes 5 seconds  Findings:                 The perianal and digital rectal examinations were                            normal.                           Multiple small and large-mouthed diverticula were                            found in the sigmoid colon and descending colon.                           Internal hemorrhoids were found during                            retroflexion. The hemorrhoids were small.  The exam was otherwise without abnormality. Complications:            No immediate complications. Estimated Blood Loss:     Estimated blood loss: none. Impression:               - Diverticulosis in the sigmoid colon and in the                            descending colon.                           - Small internal hemorrhoids.                           - The examination was otherwise normal.                           - No specimens collected. Recommendation:           - Patient has a contact number available for                            emergencies. The signs and symptoms of potential                            delayed complications were discussed with the                            patient. Return to normal activities tomorrow.                            Written discharge instructions were provided to the                            patient.                            - Resume previous diet.                           - Continue present medications.                           - Repeat colonoscopy in 5 years for surveillance. Jerene Bears, MD 04/15/2020 1:50:27 PM This report has been signed electronically.

## 2020-04-15 NOTE — Progress Notes (Signed)
Report to PACU, RN, vss, BBS= Clear.  

## 2020-04-15 NOTE — Patient Instructions (Signed)
Please read handouts provided. Continue present medications.   YOU HAD AN ENDOSCOPIC PROCEDURE TODAY AT THE [facility]:   Refer to the procedure report that was given to you for any specific questions about what was found during the examination.  If the procedure report does not answer your questions, please call your gastroenterologist to clarify.  If you requested that your care partner not be given the details of your procedure findings, then the procedure report has been included in a sealed envelope for you to review at your convenience later.  YOU SHOULD EXPECT: Some feelings of bloating in the abdomen. Passage of more gas than usual.  Walking can help get rid of the air that was put into your GI tract during the procedure and reduce the bloating. If you had a lower endoscopy (such as a colonoscopy or flexible sigmoidoscopy) you may notice spotting of blood in your stool or on the toilet paper. If you underwent a bowel prep for your procedure, you may not have a normal bowel movement for a few days.  Please Note:  You might notice some irritation and congestion in your nose or some drainage.  This is from the oxygen used during your procedure.  There is no need for concern and it should clear up in a day or so.  SYMPTOMS TO REPORT IMMEDIATELY:  Following lower endoscopy (colonoscopy or flexible sigmoidoscopy):  Excessive amounts of blood in the stool  Significant tenderness or worsening of abdominal pains  Swelling of the abdomen that is new, acute  Fever of 100F or higher   For urgent or emergent issues, a gastroenterologist can be reached at any hour by calling [phone]. Do not use MyChart messaging for urgent concerns.    DIET:  We do recommend a small meal at first, but then you may proceed to your regular diet.  Drink plenty of fluids but you should avoid alcoholic beverages for 24 hours.  ACTIVITY:  You should plan to take it easy for the rest of today and  you should NOT DRIVE or use heavy machinery until tomorrow (because of the sedation medicines used during the test).    FOLLOW UP: Our staff will call the number listed on your records 48-72 hours following your procedure to check on you and address any questions or concerns that you may have regarding the information given to you following your procedure. If we do not reach you, we will leave a message.  We will attempt to reach you two times.  During this call, we will ask if you have developed any symptoms of COVID 19. If you develop any symptoms (ie: fever, flu-like symptoms, shortness of breath, cough etc.) before then, please call [phone].  If you test positive for Covid 19 in the 2 weeks post procedure, please call and report this information to us.    If any biopsies were taken you will be contacted by phone or by letter within the next 1-3 weeks.  Please call us at [phone] if you have not heard about the biopsies in 3 weeks.    SIGNATURES/CONFIDENTIALITY: You and/or your care partner have signed paperwork which will be entered into your electronic medical record.  These signatures attest to the fact that that the information above on your After Visit Summary has been reviewed and is understood.  Full responsibility of the confidentiality of this discharge information lies with you and/or your care-partner.  

## 2020-04-17 ENCOUNTER — Telehealth: Payer: Self-pay

## 2020-04-17 DIAGNOSIS — Z23 Encounter for immunization: Secondary | ICD-10-CM | POA: Diagnosis not present

## 2020-04-17 NOTE — Telephone Encounter (Signed)
Attempted to reach patient for post-procedure f/u call. No answer. Left message that we will make another attempt to reach him later today and for him to please not hesitate to call us if he has any questions/concerns regarding his care. 

## 2020-04-17 NOTE — Telephone Encounter (Signed)
  Follow up Call-  Call back number 04/15/2020  Post procedure Call Back phone  # [phone]  Permission to leave phone message Yes  Some recent data might be hidden     Patient questions:  Do you have a fever, pain , or abdominal swelling? No. Pain Score  0 *  Have you tolerated food without any problems? Yes.    Have you been able to return to your normal activities? Yes.    Do you have any questions about your discharge instructions: Diet   No. Medications  No. Follow up visit  No.  Do you have questions or concerns about your Care? No.  Actions: * If pain score is 4 or above: No action needed, pain <4. 1. Have you developed a fever since your procedure? no  2.   Have you had an respiratory symptoms (SOB or cough) since your procedure? no  3.   Have you tested positive for COVID 19 since your procedure no  4.   Have you had any family members/close contacts diagnosed with the COVID 19 since your procedure?  no   If yes to any of these questions please route to Joylene John, RN and Erenest Rasher, RN

## 2020-04-20 ENCOUNTER — Other Ambulatory Visit: Payer: Self-pay | Admitting: Family Medicine

## 2020-05-11 ENCOUNTER — Other Ambulatory Visit: Payer: Self-pay | Admitting: Family Medicine

## 2020-05-11 DIAGNOSIS — I7300 Raynaud's syndrome without gangrene: Secondary | ICD-10-CM

## 2020-05-20 ENCOUNTER — Other Ambulatory Visit: Payer: Self-pay | Admitting: Family Medicine

## 2020-05-25 ENCOUNTER — Other Ambulatory Visit: Payer: Self-pay | Admitting: Family Medicine

## 2020-06-23 ENCOUNTER — Other Ambulatory Visit: Payer: Self-pay | Admitting: Family Medicine

## 2020-07-10 ENCOUNTER — Other Ambulatory Visit: Payer: Self-pay | Admitting: Family Medicine

## 2020-07-10 DIAGNOSIS — I7300 Raynaud's syndrome without gangrene: Secondary | ICD-10-CM

## 2020-07-20 ENCOUNTER — Other Ambulatory Visit: Payer: Self-pay

## 2020-07-20 ENCOUNTER — Other Ambulatory Visit: Payer: Self-pay | Admitting: Family Medicine

## 2020-07-20 DIAGNOSIS — I7300 Raynaud's syndrome without gangrene: Secondary | ICD-10-CM

## 2020-07-20 MED ORDER — AMLODIPINE BESYLATE 2.5 MG PO TABS: 2.5000 mg | ORAL_TABLET | Freq: Every day | ORAL | 0 refills | Status: DC

## 2020-07-20 MED ORDER — PANTOPRAZOLE SODIUM 40 MG PO TBEC: DELAYED_RELEASE_TABLET | ORAL | 0 refills | Status: DC

## 2020-07-20 NOTE — Telephone Encounter (Signed)
Stacks NTBS 30 days given 06/16/20

## 2020-07-20 NOTE — Telephone Encounter (Signed)
Appointment schedule and 30 day supply given of medication.

## 2020-07-21 NOTE — Telephone Encounter (Signed)
Lmtcb-cb 8/3 

## 2020-07-27 ENCOUNTER — Other Ambulatory Visit: Payer: Self-pay | Admitting: Family Medicine

## 2020-07-27 NOTE — Telephone Encounter (Signed)
Ov 08/19/20

## 2020-08-17 ENCOUNTER — Other Ambulatory Visit: Payer: Self-pay | Admitting: Family Medicine

## 2020-08-19 ENCOUNTER — Ambulatory Visit: Payer: BLUE CROSS/BLUE SHIELD | Admitting: Family Medicine

## 2020-08-19 ENCOUNTER — Encounter: Payer: Self-pay | Admitting: Family Medicine

## 2020-08-19 ENCOUNTER — Other Ambulatory Visit: Payer: Self-pay

## 2020-08-19 VITALS — BP 127/77 | HR 83 | Temp 97.7°F | Resp 20 | Ht 77.0 in | Wt 219.0 lb

## 2020-08-19 DIAGNOSIS — Z125 Encounter for screening for malignant neoplasm of prostate: Secondary | ICD-10-CM

## 2020-08-19 DIAGNOSIS — E559 Vitamin D deficiency, unspecified: Secondary | ICD-10-CM | POA: Diagnosis not present

## 2020-08-19 DIAGNOSIS — E782 Mixed hyperlipidemia: Secondary | ICD-10-CM

## 2020-08-19 DIAGNOSIS — K2100 Gastro-esophageal reflux disease with esophagitis, without bleeding: Secondary | ICD-10-CM

## 2020-08-19 DIAGNOSIS — J3089 Other allergic rhinitis: Secondary | ICD-10-CM

## 2020-08-19 DIAGNOSIS — I7300 Raynaud's syndrome without gangrene: Secondary | ICD-10-CM

## 2020-08-19 MED ORDER — AMLODIPINE BESYLATE 2.5 MG PO TABS: 2.5000 mg | ORAL_TABLET | Freq: Every day | ORAL | 3 refills | Status: DC

## 2020-08-19 MED ORDER — TAMSULOSIN HCL 0.4 MG PO CAPS: 0.8000 mg | ORAL_CAPSULE | Freq: Every day | ORAL | 3 refills | Status: DC

## 2020-08-19 MED ORDER — PANTOPRAZOLE SODIUM 40 MG PO TBEC: DELAYED_RELEASE_TABLET | ORAL | 3 refills | Status: DC

## 2020-08-19 MED ORDER — DICLOFENAC SODIUM 75 MG PO TBEC: DELAYED_RELEASE_TABLET | ORAL | 3 refills | Status: DC

## 2020-08-19 NOTE — Progress Notes (Signed)
Subjective:  Patient ID: Danny Snyder, male    DOB: [birth year]  Age: 54 y.o. MRN: [id number]  CC: Medication Refill   HPI Danny Snyder presents for patient in for follow-up of GERD. Currently asymptomatic taking  PPI daily. There is no chest pain or heartburn. No hematemesis and no melena. No dysphagia or choking. Onset is remote. Progression is stable. Complicating factors, none.  Patient has allergic rhinitis symptoms including sneezing frequently sniffling, clear rhinorrhea, watery and itchy eyes. There has been no fever no chills no sweats. No earaches. There is some scratchy throat but no sore throat or difficulty swallowing. There is some nasal congestion.  Patient in for follow-up of elevated cholesterol. Doing well without complaints on current medication. Denies side effects of statin including myalgia and arthralgia and nausea. Also in today for liver function testing. Currently no chest pain, shortness of breath or other cardiovascular related symptoms noted.  Patient also deals with Raynaud's disease.  This seems to be stable as long as he takes the amlodipine.  His fingertips would get numb and tingle and hurt.  Depression screen [facility] 2/9 08/19/2020 01/08/2020 10/17/2019  Decreased Interest 0 0 0  Down, Depressed, Hopeless 0 0 0  PHQ - 2 Score 0 0 0    History Danny Snyder has a past medical history of Allergy, Arthritis, GERD (gastroesophageal reflux disease), and Raynaud's disease.   He has a past surgical history that includes Inguinal hernia repair and Colonoscopy.   His family history includes Arthritis in his brother and mother; Cancer in his father; Diabetes in his father; Stomach cancer in his father.He reports that he quit smoking about 18 years ago. He has quit using smokeless tobacco.  His smokeless tobacco use included chew. He reports current alcohol use. He reports that he does not use drugs.    ROS Review of Systems  Constitutional: Negative.   HENT: Negative.    Eyes: Negative for visual disturbance.  Respiratory: Negative for cough and shortness of breath.   Cardiovascular: Negative for chest pain and leg swelling.  Gastrointestinal: Negative for abdominal pain, diarrhea, nausea and vomiting.  Genitourinary: Negative for difficulty urinating.  Musculoskeletal: Negative for arthralgias and myalgias.  Skin: Negative for rash.  Neurological: Negative for headaches.  Psychiatric/Behavioral: Negative for sleep disturbance.    Objective:  BP 127/77   Pulse 83   Temp 97.7 F (36.5 C) (Temporal)   Resp 20   Ht $R'6\' 5"'Vi$  (1.956 m)   Wt 219 lb (99.3 kg)   SpO2 99%   BMI 25.97 kg/m   BP Readings from Last 3 Encounters:  08/19/20 127/77  04/15/20 122/78  01/21/20 140/80    Wt Readings from Last 3 Encounters:  08/19/20 219 lb (99.3 kg)  04/15/20 217 lb (98.4 kg)  01/21/20 217 lb 12.8 oz (98.8 kg)     Physical Exam Vitals reviewed.  Constitutional:      Appearance: He is well-developed.  HENT:     Head: Normocephalic and atraumatic.     Right Ear: Tympanic membrane and external ear normal. No decreased hearing noted.     Left Ear: Tympanic membrane and external ear normal. No decreased hearing noted.     Mouth/Throat:     Pharynx: No oropharyngeal exudate or posterior oropharyngeal erythema.  Eyes:     Pupils: Pupils are equal, round, and reactive to light.  Cardiovascular:     Rate and Rhythm: Normal rate and regular rhythm.     Heart sounds: No murmur  heard.   Pulmonary:     Effort: No respiratory distress.     Breath sounds: Normal breath sounds.  Abdominal:     General: Bowel sounds are normal.     Palpations: Abdomen is soft. There is no mass.     Tenderness: There is no abdominal tenderness.  Musculoskeletal:     Cervical back: Normal range of motion and neck supple.       Assessment & Plan:   Danny Snyder was seen today for medication refill.  Diagnoses and all orders for this visit:  Gastroesophageal reflux disease  with esophagitis without hemorrhage -     CBC with Differential/Platelet -     CMP14+EGFR -     Urinalysis  Raynaud's disease without gangrene -     amLODipine (NORVASC) 2.5 MG tablet; Take 1 tablet (2.5 mg total) by mouth daily. (Needs to be seen before next refill) -     CBC with Differential/Platelet -     CMP14+EGFR -     Urinalysis  Non-seasonal allergic rhinitis, unspecified trigger -     CBC with Differential/Platelet -     CMP14+EGFR -     Urinalysis  Screening for prostate cancer -     PSA Total (Reflex To Free)  Vitamin D deficiency -     VITAMIN D 25 Hydroxy (Vit-D Deficiency, Fractures)  Mixed hyperlipidemia -     Lipid panel  Other orders -     diclofenac (VOLTAREN) 75 MG EC tablet; TAKE 1 TABLET TWICE DAILY AS NEEDED FOR MUSCLE AND JOINT PAIN -     tamsulosin (FLOMAX) 0.4 MG CAPS capsule; Take 2 capsules (0.8 mg total) by mouth at bedtime. (Needs to be seen before next refill) -     pantoprazole (PROTONIX) 40 MG tablet; TAKE 1 TABLET TWICE DAILY FOR STOMACH.  Needs to be seen before next refill       I have discontinued [name] "Alvester Chou Amacher"'s psyllium and hyoscyamine. I am also having him maintain his aspirin, Fexofenadine-Pseudoephedrine (ALLEGRA-D 24 HOUR PO), mometasone, polyethylene glycol, amLODipine, diclofenac, tamsulosin, and pantoprazole.  Allergies as of 08/19/2020      Reactions   Lodine [etodolac] Other (See Comments)   Mouth sores       Medication List       Accurate as of August 19, 2020  9:43 PM. If you have any questions, ask your nurse or doctor.        STOP taking these medications   hyoscyamine 0.125 MG SL tablet Commonly known as: LEVSIN SL Stopped by: Claretta Fraise, MD   psyllium 58.6 % packet Commonly known as: METAMUCIL Stopped by: Claretta Fraise, MD     TAKE these medications   ALLEGRA-D 24 HOUR PO   amLODipine 2.5 MG tablet Commonly known as: NORVASC Take 1 tablet (2.5 mg total) by mouth daily. (Needs  to be seen before next refill)   aspirin 81 MG tablet Take 81 mg by mouth daily.   diclofenac 75 MG EC tablet Commonly known as: VOLTAREN TAKE 1 TABLET TWICE DAILY AS NEEDED FOR MUSCLE AND JOINT PAIN   mometasone 50 MCG/ACT nasal spray Commonly known as: Nasonex Place 2 sprays into the nose daily.   pantoprazole 40 MG tablet Commonly known as: PROTONIX TAKE 1 TABLET TWICE DAILY FOR STOMACH.  Needs to be seen before next refill   polyethylene glycol 17 g packet Commonly known as: MIRALAX / GLYCOLAX Take 17 g by mouth daily.   tamsulosin 0.4 MG Caps  capsule Commonly known as: FLOMAX Take 2 capsules (0.8 mg total) by mouth at bedtime. (Needs to be seen before next refill)        Follow-up: Return in about 1 year (around 08/19/2021) for [facility] physical.  Claretta Fraise, M.D.

## 2020-08-20 LAB — CBC WITH DIFFERENTIAL/PLATELET
Basophils Absolute: 0 10*3/uL (ref 0.0–0.2)
Basos: 0 %
EOS (ABSOLUTE): 0.1 10*3/uL (ref 0.0–0.4)
Eos: 3 %
Hematocrit: 41.8 % (ref 37.5–51.0)
Hemoglobin: 13.8 g/dL (ref 13.0–17.7)
Immature Grans (Abs): 0 10*3/uL (ref 0.0–0.1)
Immature Granulocytes: 0 %
Lymphocytes Absolute: 1.2 10*3/uL (ref 0.7–3.1)
Lymphs: 26 %
MCH: 28.8 pg (ref 26.6–33.0)
MCHC: 33 g/dL (ref 31.5–35.7)
MCV: 87 fL (ref 79–97)
Monocytes Absolute: 0.5 10*3/uL (ref 0.1–0.9)
Monocytes: 11 %
Neutrophils Absolute: 2.8 10*3/uL (ref 1.4–7.0)
Neutrophils: 60 %
Platelets: 206 10*3/uL (ref 150–450)
RBC: 4.79 x10E6/uL (ref 4.14–5.80)
RDW: 13.4 % (ref 11.6–15.4)
WBC: 4.6 10*3/uL (ref 3.4–10.8)

## 2020-08-20 LAB — CMP14+EGFR
ALT: 24 IU/L (ref 0–44)
AST: 25 IU/L (ref 0–40)
Albumin/Globulin Ratio: 2.3 — ABNORMAL HIGH (ref 1.2–2.2)
Albumin: 4.3 g/dL (ref 3.8–4.9)
Alkaline Phosphatase: 67 IU/L (ref 48–121)
BUN/Creatinine Ratio: 17 (ref 9–20)
BUN: 15 mg/dL (ref 6–24)
Bilirubin Total: 0.3 mg/dL (ref 0.0–1.2)
CO2: 22 mmol/L (ref 20–29)
Calcium: 9.5 mg/dL (ref 8.7–10.2)
Chloride: 104 mmol/L (ref 96–106)
Creatinine, Ser: 0.87 mg/dL (ref 0.76–1.27)
GFR calc Af Amer: 114 mL/min/{1.73_m2} (ref >59)
GFR calc non Af Amer: 99 mL/min/{1.73_m2} (ref >59)
Globulin, Total: 1.9 g/dL (ref 1.5–4.5)
Glucose: 112 mg/dL — ABNORMAL HIGH (ref 65–99)
Potassium: 4.3 mmol/L (ref 3.5–5.2)
Sodium: 140 mmol/L (ref 134–144)
Total Protein: 6.2 g/dL (ref 6.0–8.5)

## 2020-08-20 LAB — VITAMIN D 25 HYDROXY (VIT D DEFICIENCY, FRACTURES): Vit D, 25-Hydroxy: 30.9 ng/mL (ref 30.0–100.0)

## 2020-08-20 LAB — LIPID PANEL
Chol/HDL Ratio: 4.4 ratio (ref 0.0–5.0)
Cholesterol, Total: 206 mg/dL — ABNORMAL HIGH (ref 100–199)
HDL: 47 mg/dL (ref >39)
LDL Chol Calc (NIH): 126 mg/dL — ABNORMAL HIGH (ref 0–99)
Triglycerides: 185 mg/dL — ABNORMAL HIGH (ref 0–149)
VLDL Cholesterol Cal: 33 mg/dL (ref 5–40)

## 2020-08-20 LAB — PSA TOTAL (REFLEX TO FREE): Prostate Specific Ag, Serum: 0.7 ng/mL (ref 0.0–4.0)

## 2020-10-12 IMAGING — DX DG LUMBAR SPINE 2-3V
2 series · 3 of 3 positions shown · non-contrast
Comparison: None.

CLINICAL DATA: Right-sided low back pain and right lower quadrant
pain.

EXAM:
LUMBAR SPINE - 2-3 VIEW

[l-spine ap]
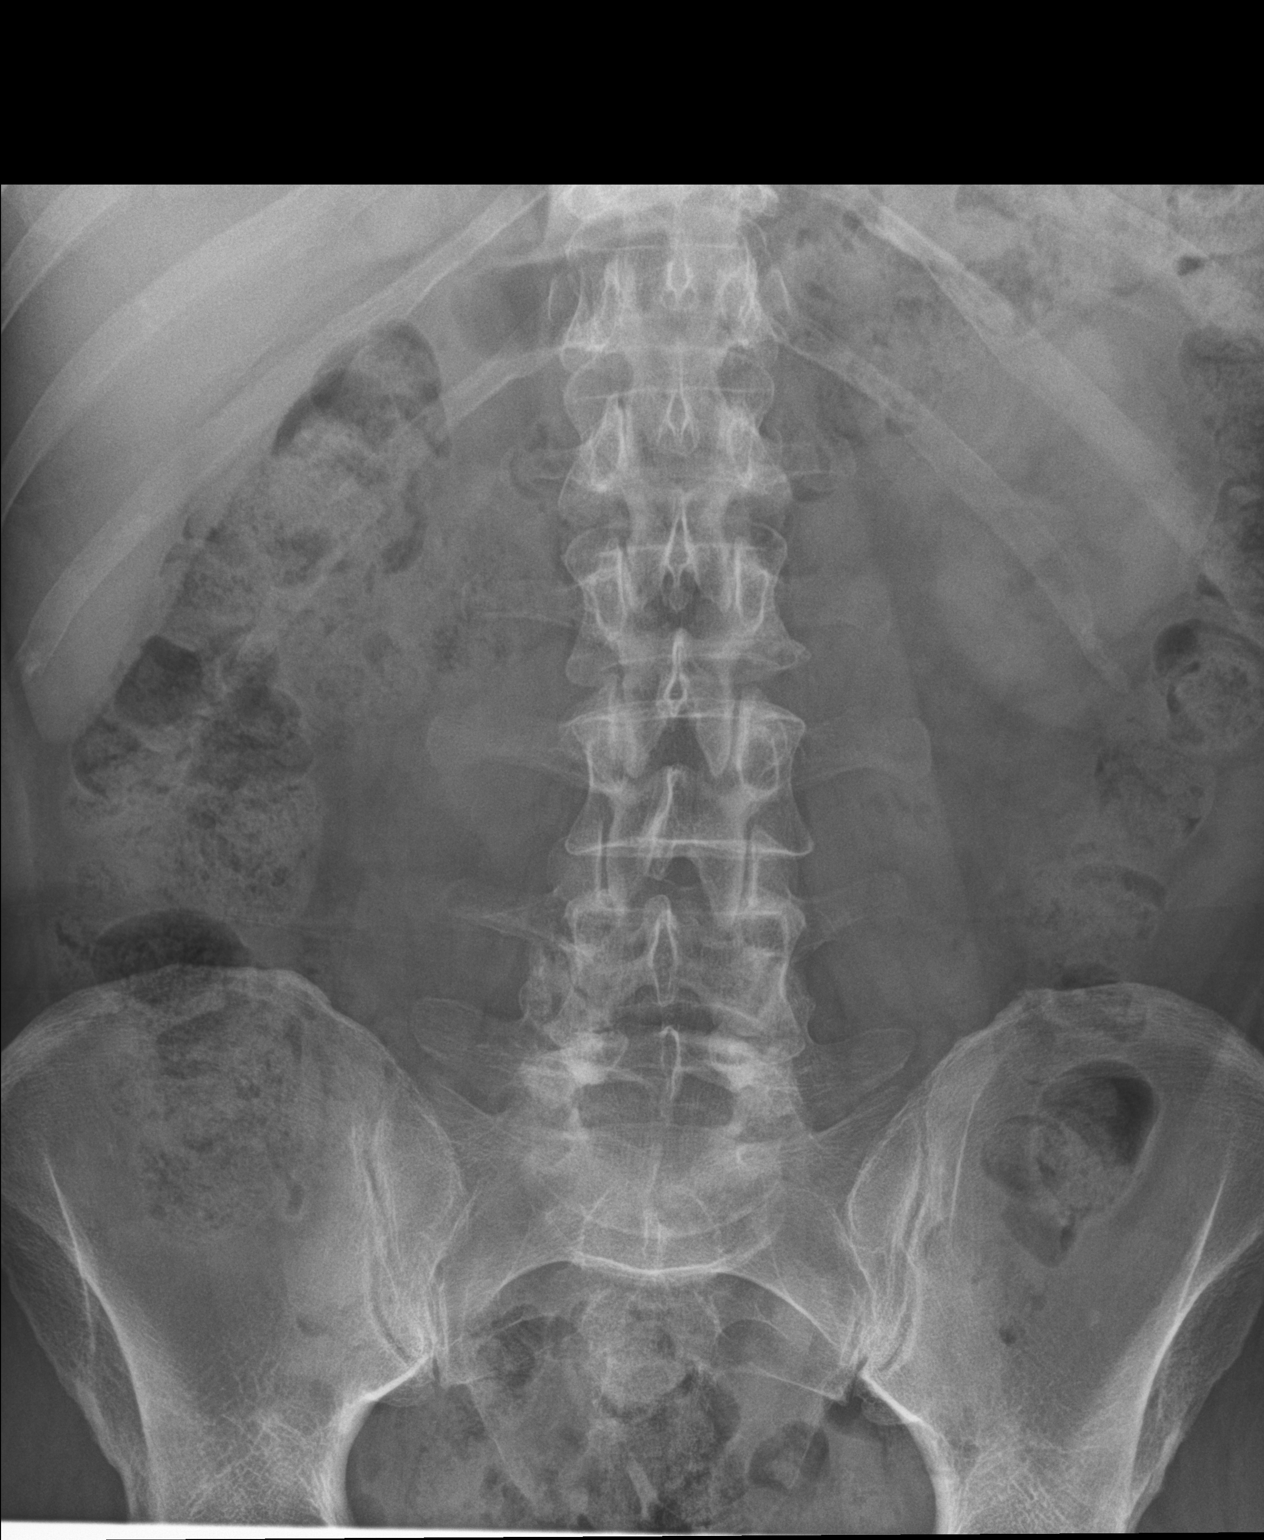

[Series 2: l-spine lat · 0.14mm/px · 2 of 2 slices shown]
[im 1/2]
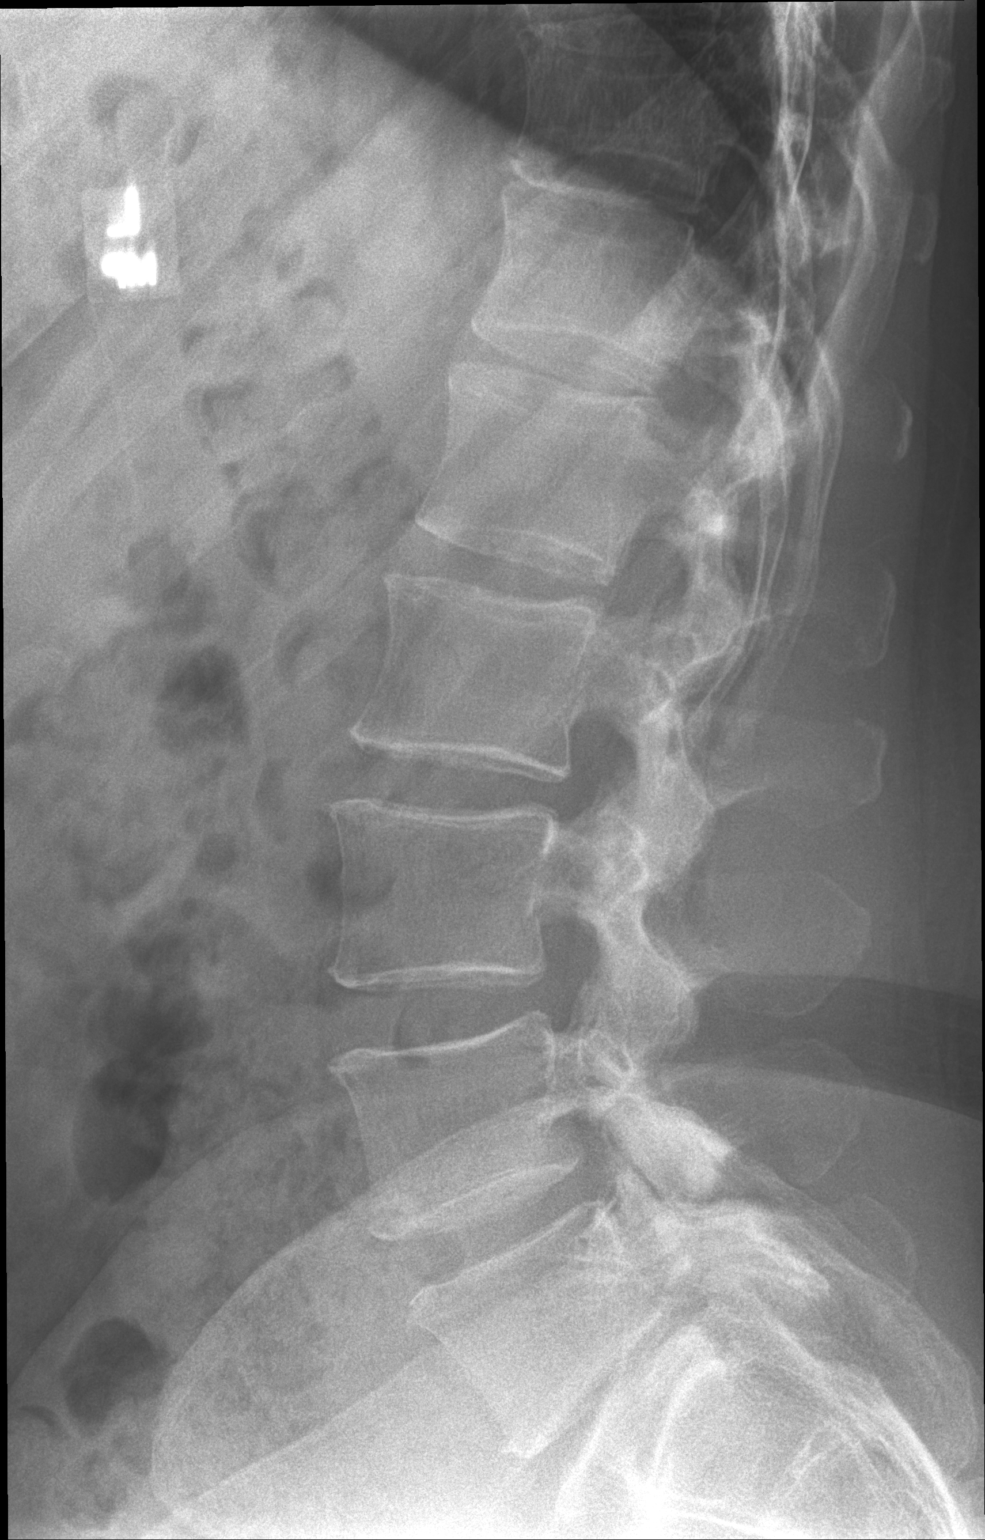
[im 2/2]
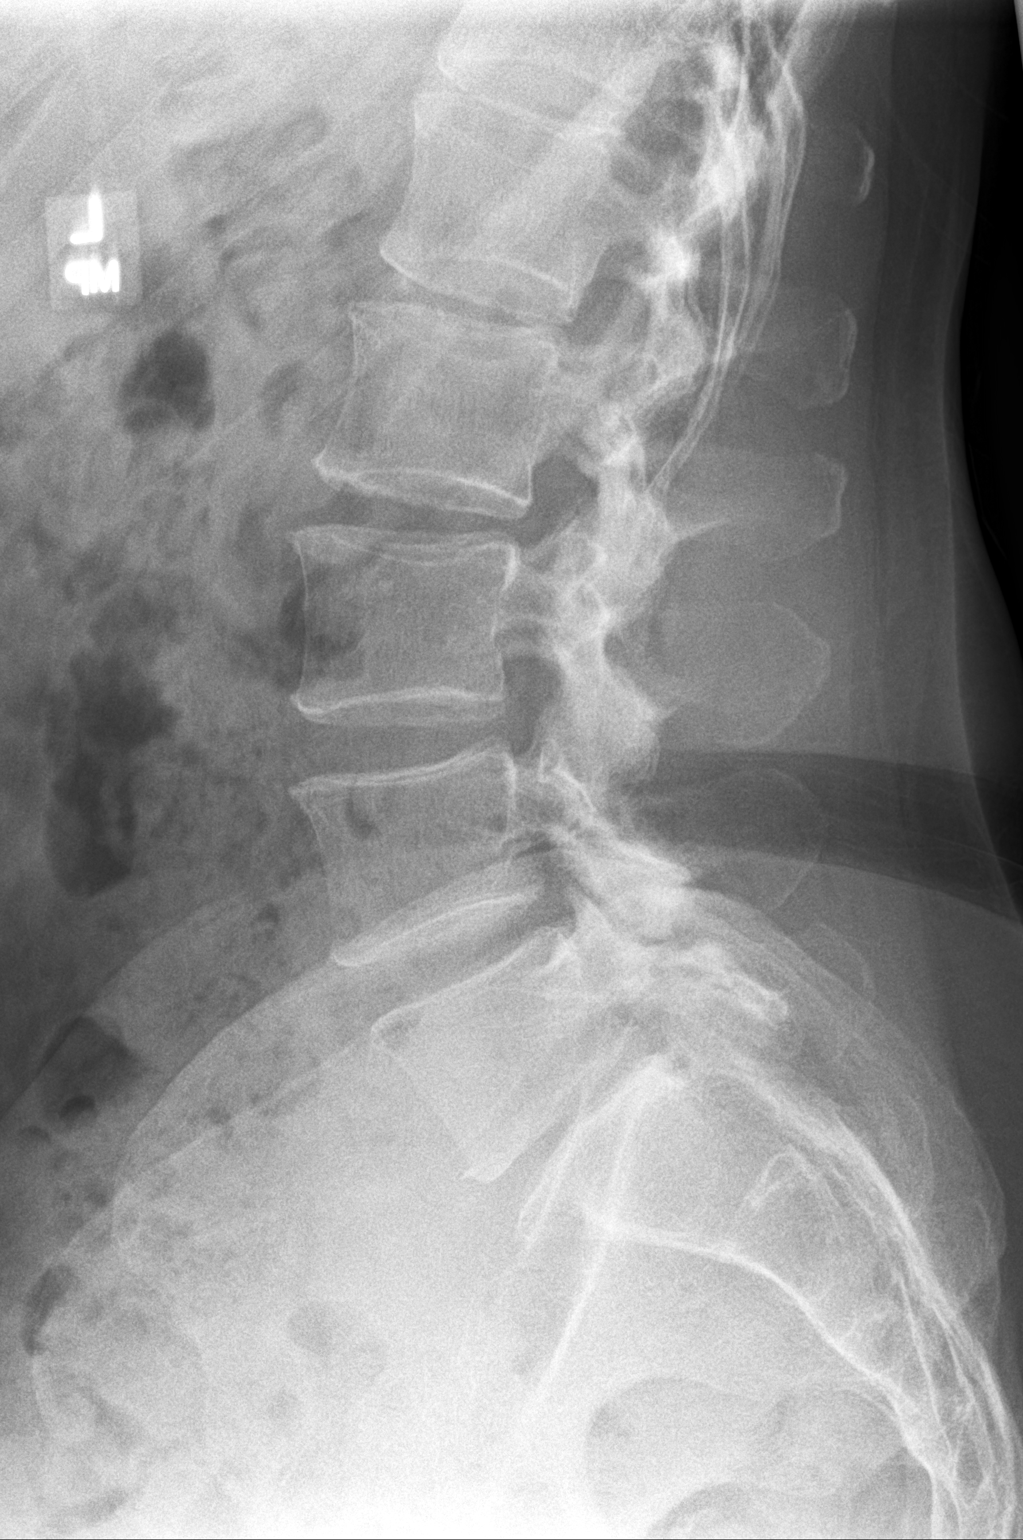

[3 of 3 positions shown; findings below may reference images not displayed]

FINDINGS: There is no fracture or bone destruction. There is moderately severe
bilateral facet arthritis at L4-5. There is disc space narrowing at
L5-S1. Lateral alignment is normal.
IMPRESSION: Degenerative disc at L5-S1. Facet arthritis at L4-5.

## 2020-10-12 IMAGING — DX DG SI JOINTS 3+V
3 series · 3 of 3 positions shown · non-contrast
Comparison: None.

CLINICAL DATA: Sacroiliac pain. Right-sided low back pain and right
lower quadrant pain.

EXAM:
BILATERAL SACROILIAC JOINTS - 3+ VIEW

[si joint (1 of 3)]
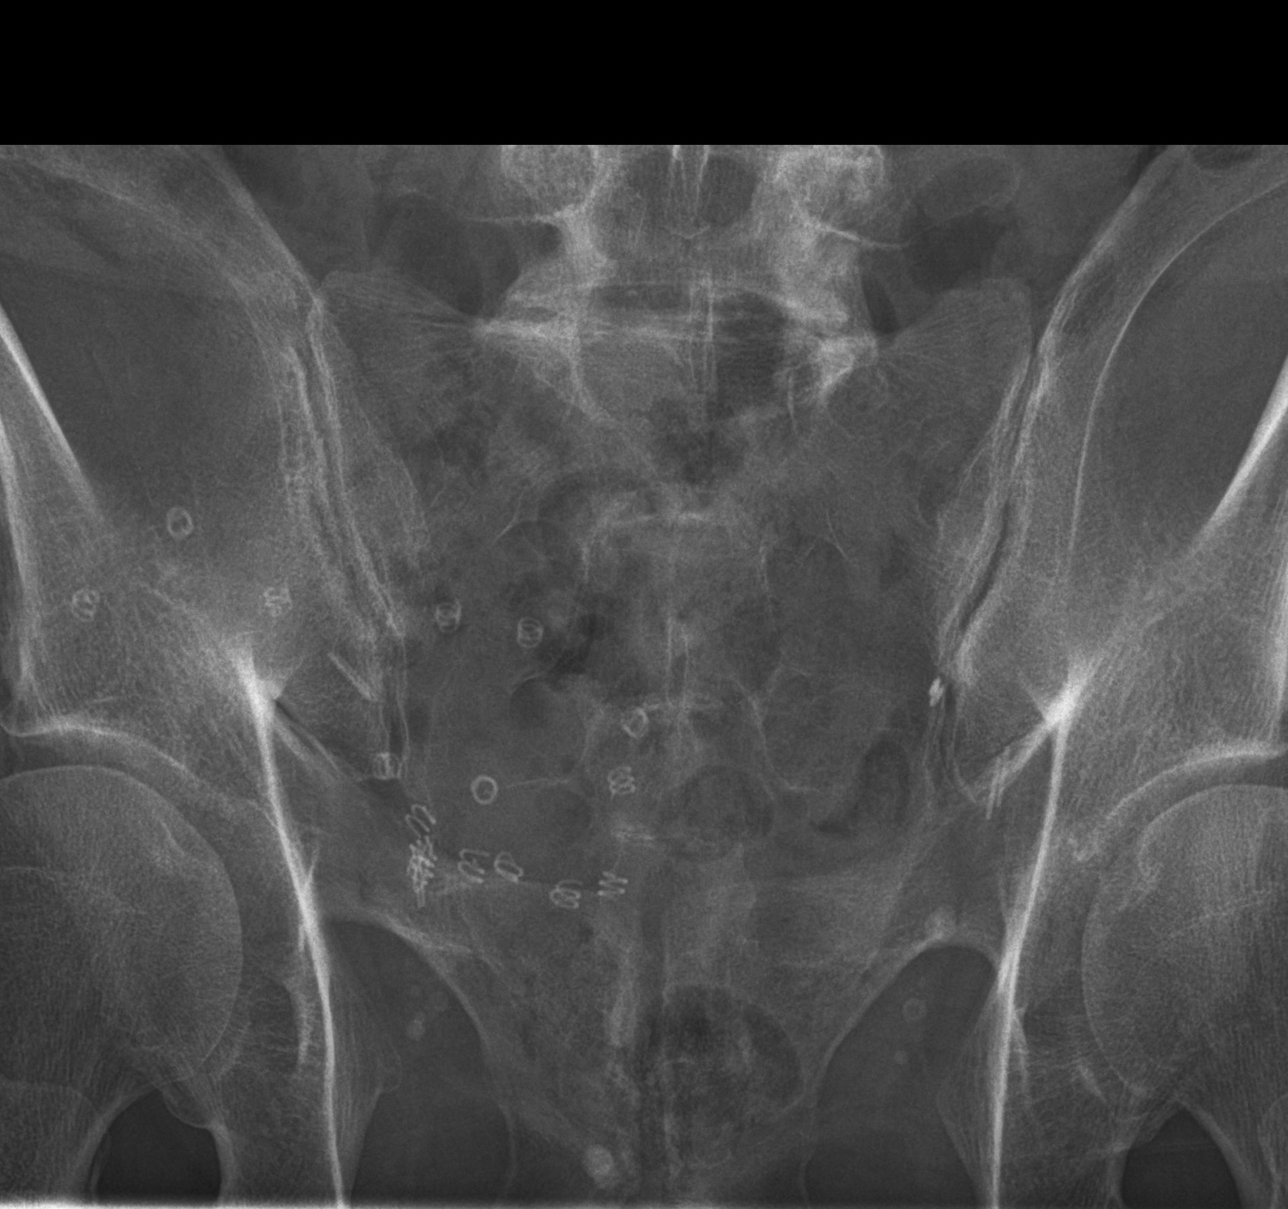

[si joint (2 of 3)]
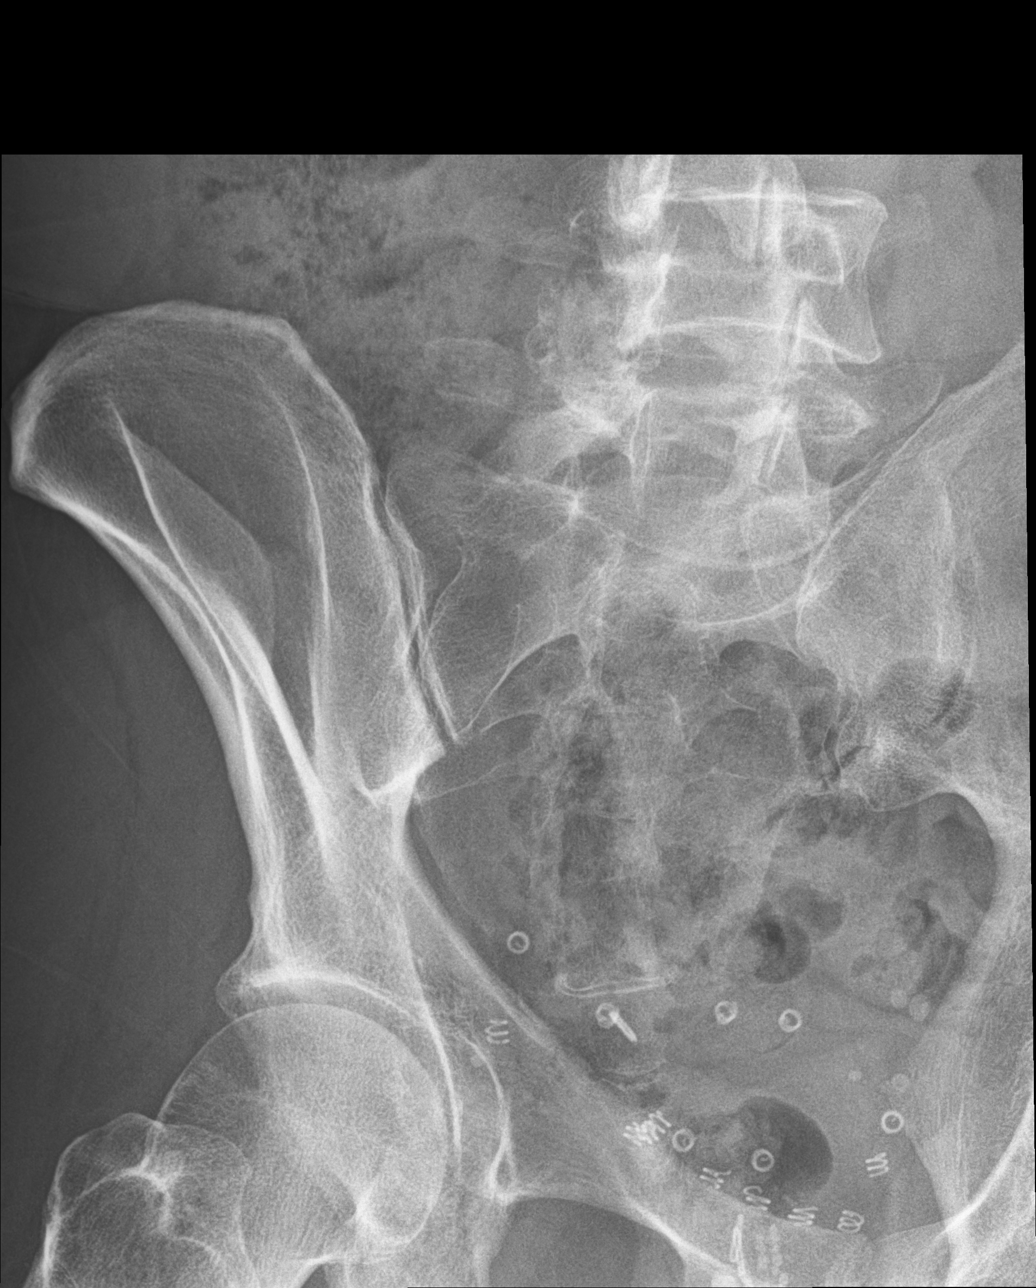

[si joint (3 of 3)]
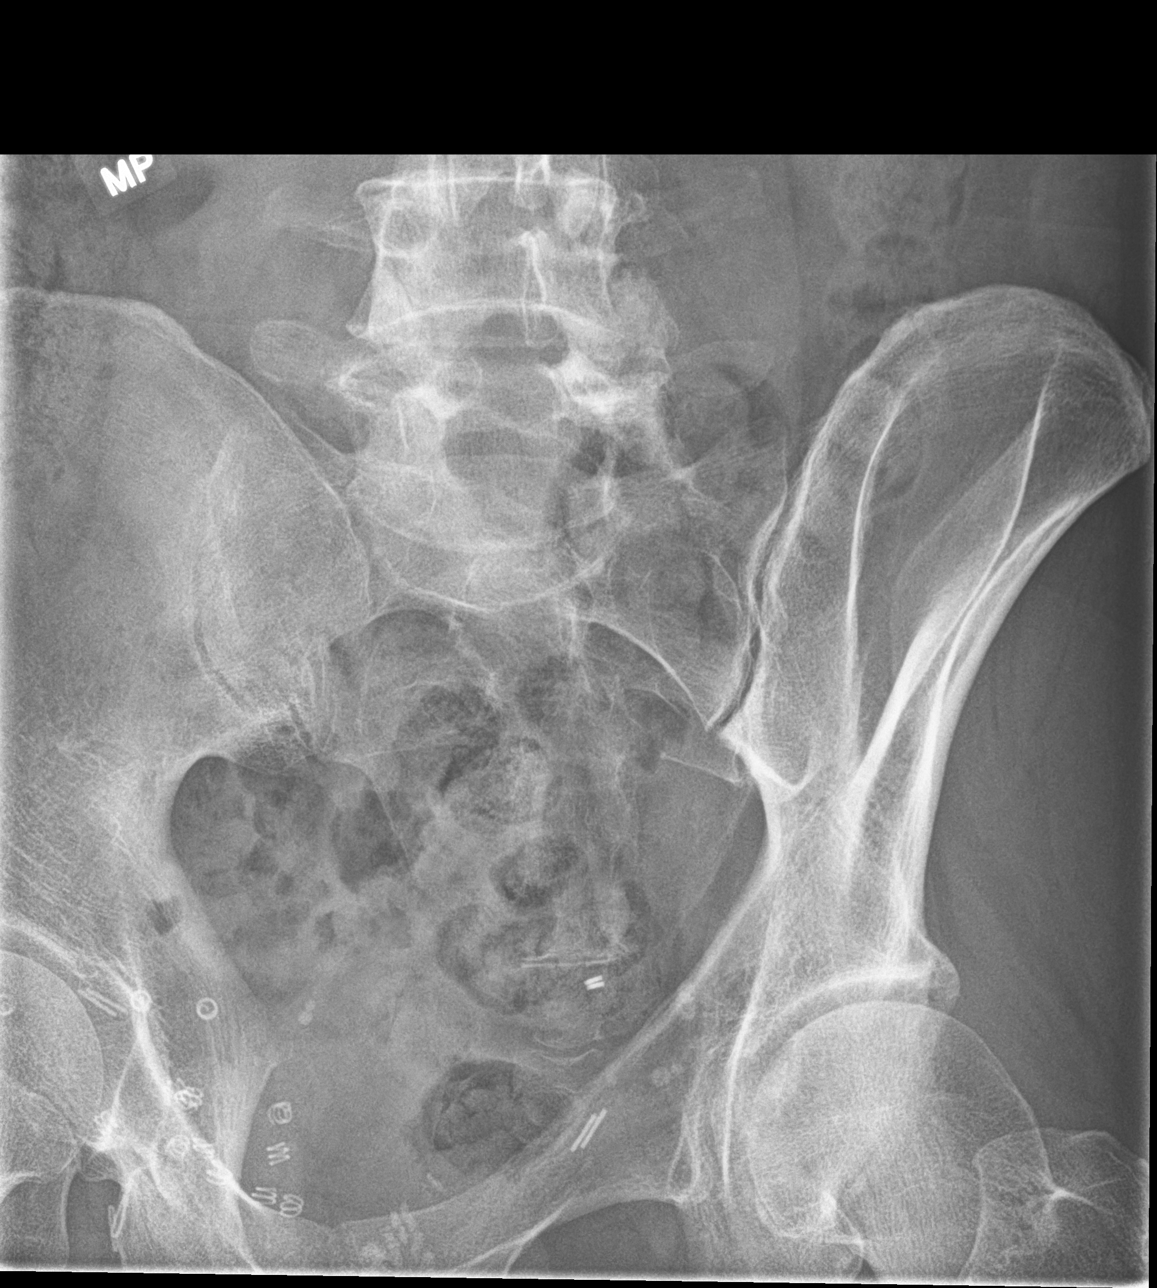

[3 of 3 positions shown; findings below may reference images not displayed]

FINDINGS: The sacroiliac joint spaces are maintained and there is no evidence
of arthropathy. No other bone abnormalities are seen.
IMPRESSION: Normal sacroiliac joints.

## 2020-10-20 IMAGING — US US ABDOMEN COMPLETE
1 series · 14 of 25 positions shown · non-contrast
Comparison: None.

CLINICAL DATA: Epigastric pain

EXAM:
ABDOMEN ULTRASOUND COMPLETE

[Series 1: us abdomen complete · 0.20mm/px · 14 of 165 slices shown]
[im 1/165]
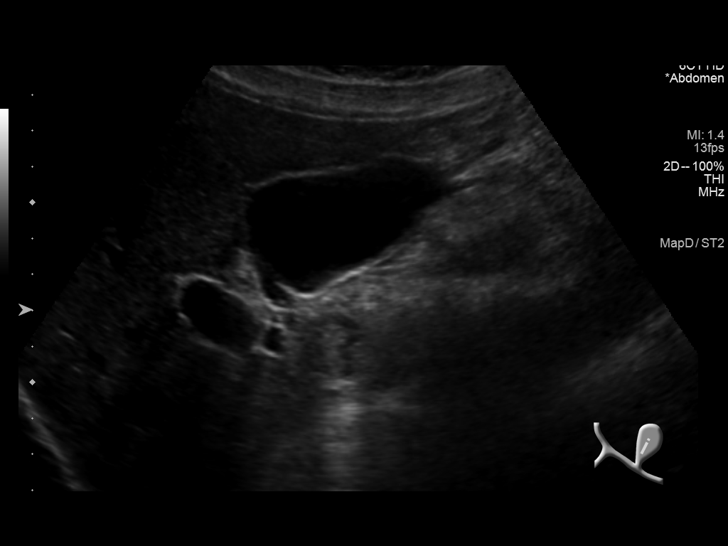
[im 14/165]
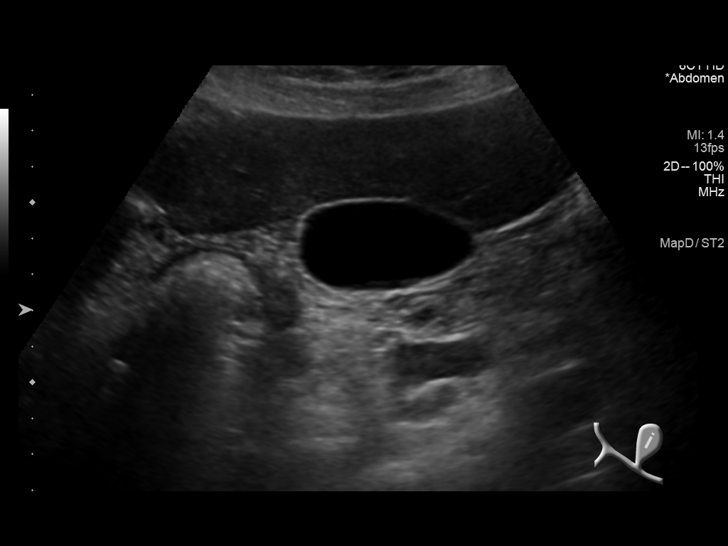
[im 28/165]
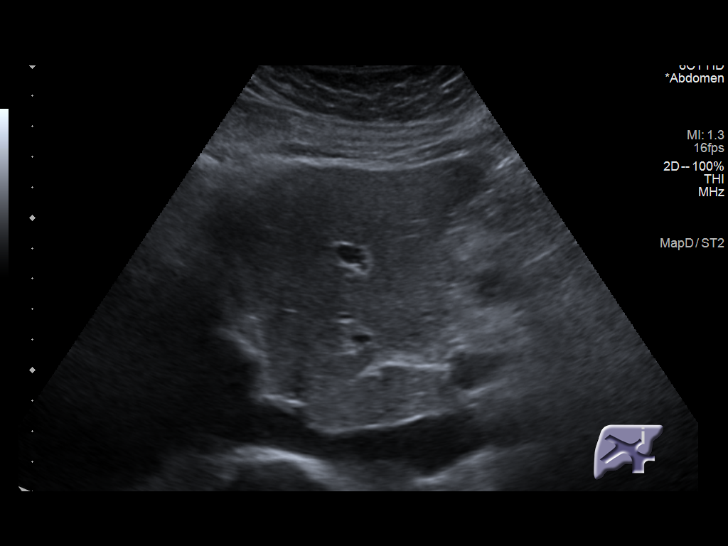
[im 42/165]
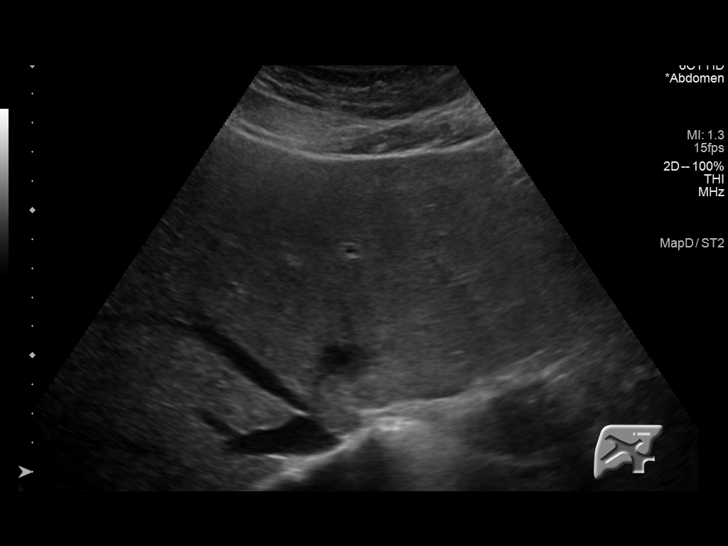
[im 55/165]
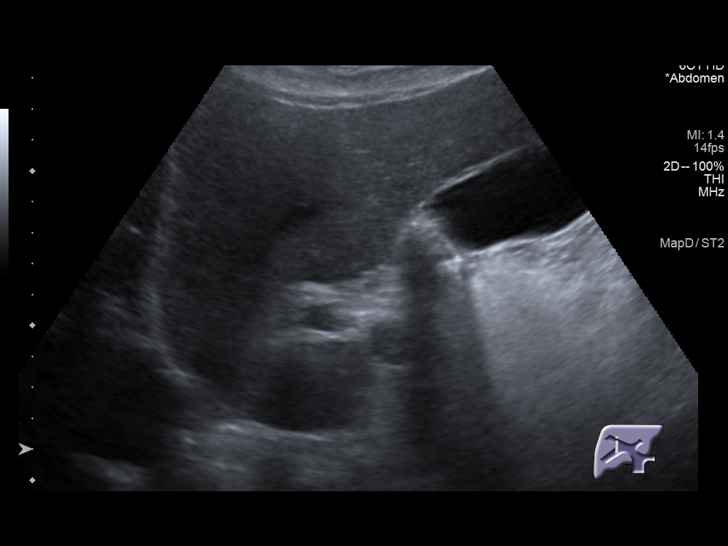
[im 62/165]
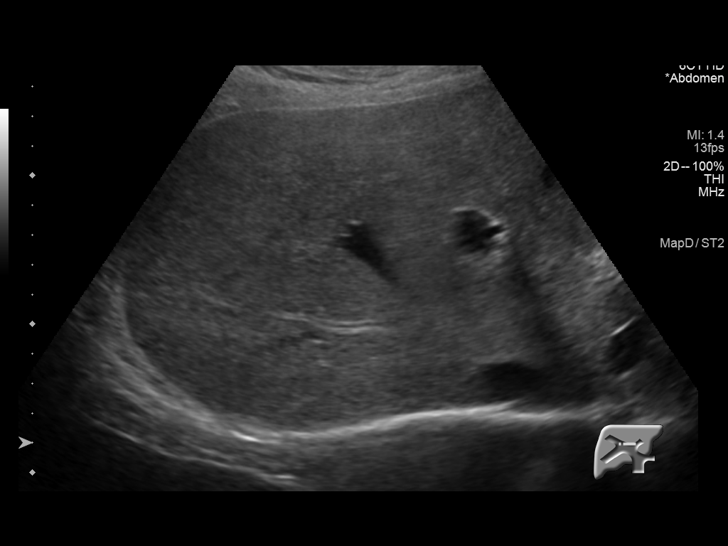
[im 76/165]
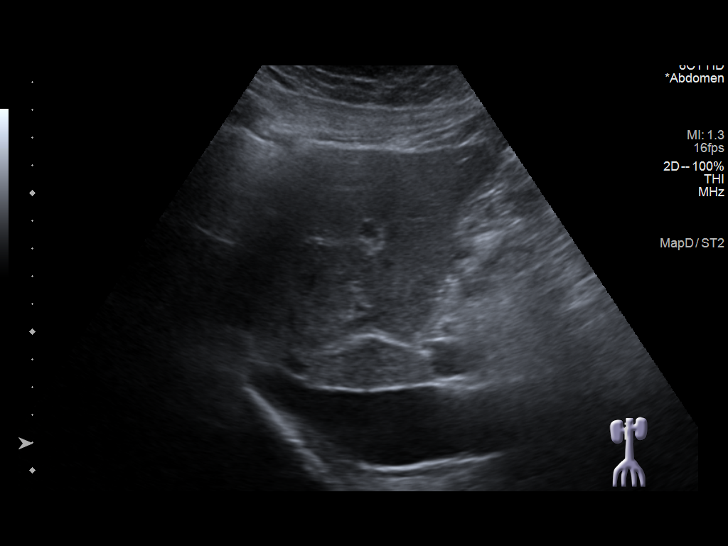
[im 89/165]
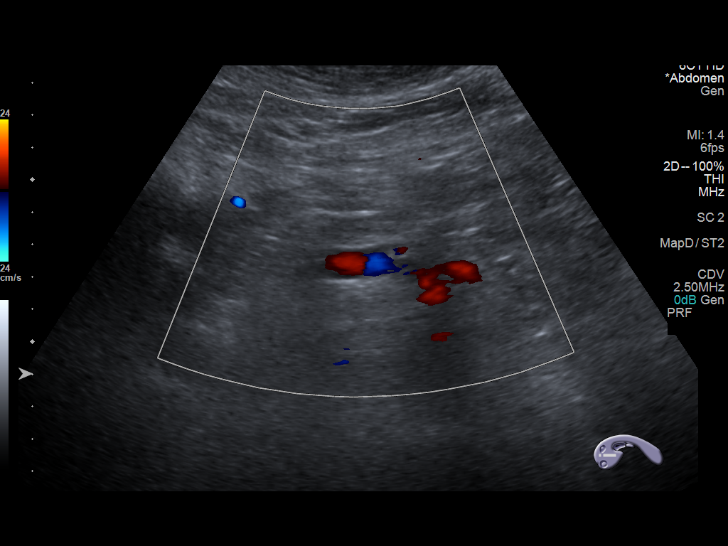
[im 103/165]
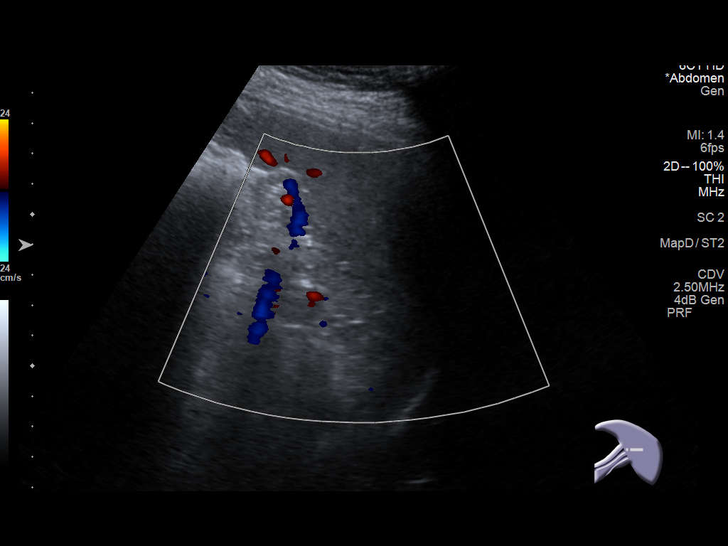
[im 110/165]
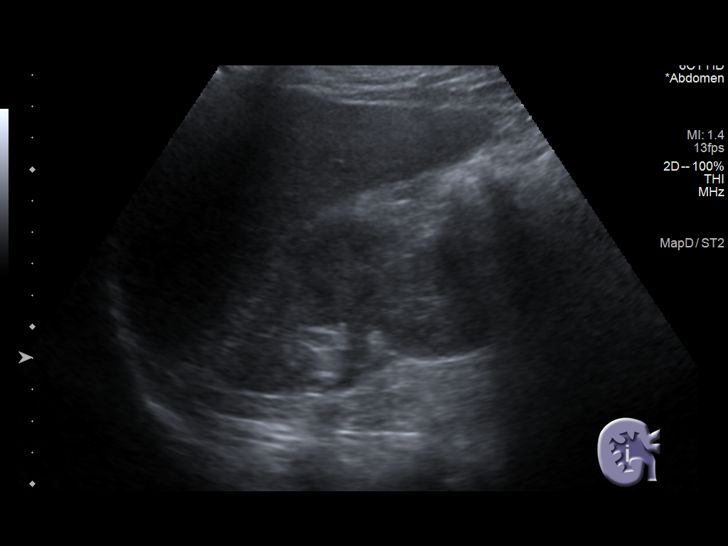
[im 124/165]
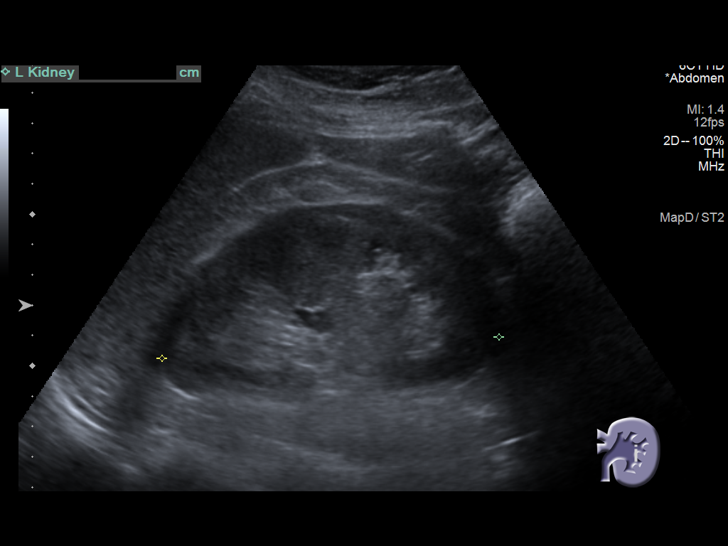
[im 137/165]
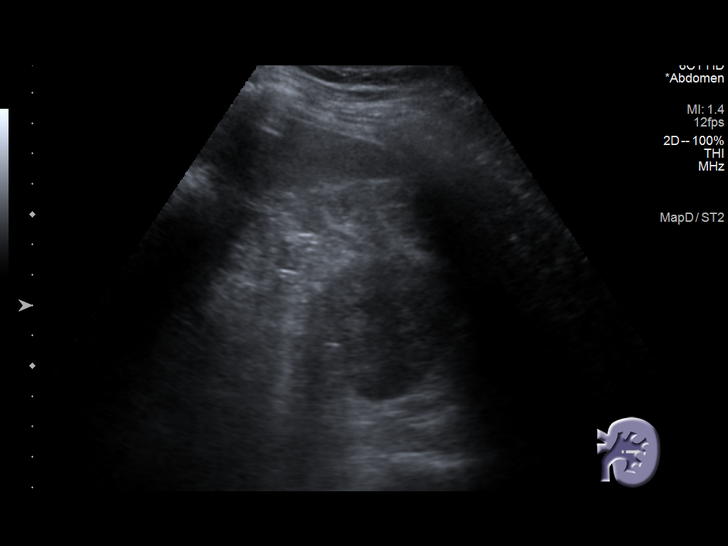
[im 151/165]
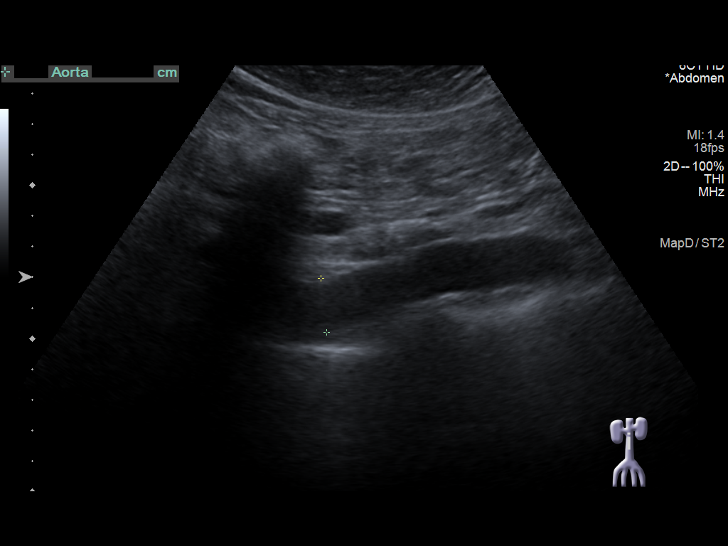
[im 165/165]
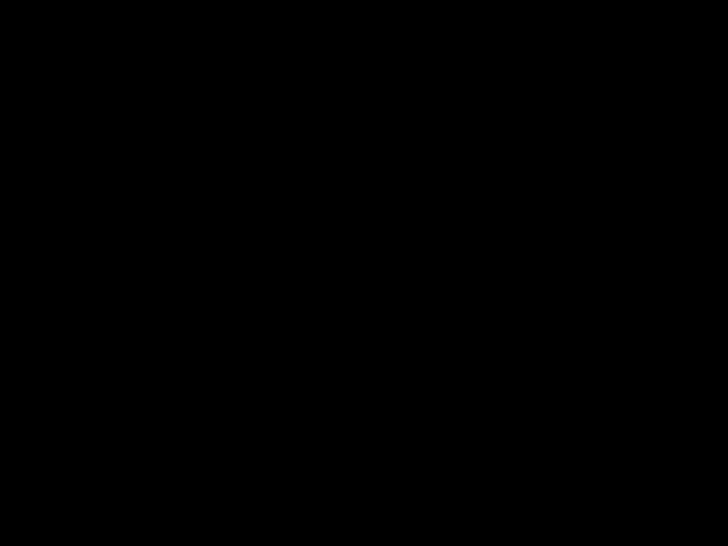

[14 of 25 positions shown; findings below may reference images not displayed]

FINDINGS: Gallbladder: No gallstones or wall thickening visualized. No
sonographic Murphy sign noted by sonographer.

Common bile duct: Diameter: 2 mm

Liver: No focal lesion identified. Within normal limits in
parenchymal echogenicity. Portal vein is patent on color Doppler
imaging with normal direction of blood flow towards the liver.

IVC: No abnormality visualized.

Pancreas: Visualized portion unremarkable with limited evaluation
due to overlying bowel gas.

Spleen: Size and appearance within normal limits.

Right Kidney: Length: 12.1 cm. Echogenicity within normal limits. No
mass or hydronephrosis visualized.

Left Kidney: Length: 11.1 cm. Echogenicity within normal limits.
Possible 2-3 mm calculus in the interpolar region. No mass or
hydronephrosis visualized.

Abdominal aorta: No aneurysm visualized.

Other findings: None.
IMPRESSION: Possible small left renal calculus.  Otherwise unremarkable study.

## 2020-12-28 ENCOUNTER — Other Ambulatory Visit: Payer: Self-pay | Admitting: Family Medicine

## 2020-12-28 NOTE — Telephone Encounter (Signed)
OV 08/19/20 RTC 1 yr

## 2021-08-24 ENCOUNTER — Other Ambulatory Visit: Payer: Self-pay | Admitting: Family Medicine

## 2021-09-07 ENCOUNTER — Other Ambulatory Visit: Payer: Self-pay | Admitting: Family Medicine

## 2021-09-07 DIAGNOSIS — I7300 Raynaud's syndrome without gangrene: Secondary | ICD-10-CM

## 2021-09-20 ENCOUNTER — Telehealth: Payer: Self-pay | Admitting: Family Medicine

## 2021-09-20 ENCOUNTER — Other Ambulatory Visit: Payer: Self-pay | Admitting: Family Medicine

## 2021-09-20 ENCOUNTER — Other Ambulatory Visit: Payer: Self-pay | Admitting: *Deleted

## 2021-09-20 MED ORDER — PANTOPRAZOLE SODIUM 40 MG PO TBEC: DELAYED_RELEASE_TABLET | ORAL | 0 refills | Status: DC

## 2021-09-20 NOTE — Telephone Encounter (Signed)
  Prescription Request  09/20/2021  Is this a "Controlled Substance" medicine? no Have you seen your PCP in the last 2 weeks? no If YES, route message to pool  -  If NO, patient needs to be scheduled for appointment.  What is the name of the medication or equipment?pantoprazole (PROTONIX) 40 MG tablet   Have you contacted your pharmacy to request a refill? Yes but ntbs and pt has two days left and asked that a message be sent to nurse for a refill till apt. He is scheduled for 10/27/2021 with [facility] would you like this sent to? [place]   Patient notified that their request is being sent to the clinical staff for review and that they should receive a response within 2 business days.

## 2021-09-22 ENCOUNTER — Encounter: Payer: Self-pay | Admitting: Family Medicine

## 2021-09-22 ENCOUNTER — Other Ambulatory Visit: Payer: Self-pay | Admitting: Family Medicine

## 2021-09-23 MED ORDER — PANTOPRAZOLE SODIUM 40 MG PO TBEC: DELAYED_RELEASE_TABLET | ORAL | 0 refills | Status: DC

## 2021-10-08 ENCOUNTER — Other Ambulatory Visit: Payer: Self-pay | Admitting: Family Medicine

## 2021-10-08 DIAGNOSIS — I7300 Raynaud's syndrome without gangrene: Secondary | ICD-10-CM

## 2021-10-27 ENCOUNTER — Encounter: Payer: Self-pay | Admitting: Family Medicine

## 2021-10-27 ENCOUNTER — Other Ambulatory Visit: Payer: Self-pay

## 2021-10-27 ENCOUNTER — Ambulatory Visit (INDEPENDENT_AMBULATORY_CARE_PROVIDER_SITE_OTHER): Payer: 59 | Admitting: Family Medicine

## 2021-10-27 VITALS — BP 126/73 | HR 83 | Temp 98.0°F | Ht 77.0 in | Wt 219.2 lb

## 2021-10-27 DIAGNOSIS — E559 Vitamin D deficiency, unspecified: Secondary | ICD-10-CM | POA: Diagnosis not present

## 2021-10-27 DIAGNOSIS — F5101 Primary insomnia: Secondary | ICD-10-CM

## 2021-10-27 DIAGNOSIS — I952 Hypotension due to drugs: Secondary | ICD-10-CM

## 2021-10-27 DIAGNOSIS — N401 Enlarged prostate with lower urinary tract symptoms: Secondary | ICD-10-CM

## 2021-10-27 DIAGNOSIS — E782 Mixed hyperlipidemia: Secondary | ICD-10-CM | POA: Diagnosis not present

## 2021-10-27 DIAGNOSIS — Z125 Encounter for screening for malignant neoplasm of prostate: Secondary | ICD-10-CM

## 2021-10-27 DIAGNOSIS — R350 Frequency of micturition: Secondary | ICD-10-CM

## 2021-10-27 MED ORDER — SILODOSIN 8 MG PO CAPS: 8.0000 mg | ORAL_CAPSULE | Freq: Every day | ORAL | 3 refills | Status: DC

## 2021-10-27 MED ORDER — TRAZODONE HCL 150 MG PO TABS: ORAL_TABLET | ORAL | 5 refills | Status: DC

## 2021-10-27 MED ORDER — PANTOPRAZOLE SODIUM 40 MG PO TBEC: DELAYED_RELEASE_TABLET | ORAL | 3 refills | Status: DC

## 2021-10-27 NOTE — Progress Notes (Signed)
Subjective:  Patient ID: Danny Snyder, male    DOB: 1966-08-25  Age: 55 y.o. MRN: [id number]  CC: Medical Management of Chronic Issues   HPI DMANI MIZER presents for  follow-up of hypertension. Patient has no history of headache chest pain or shortness of breath or recent cough. Patient also denies symptoms of TIA such as focal numbness or weakness. Patient denies side effects from medication. States taking it regularly.  Can go 3-4 weeks without trouble sleeping, but then will have several night without sleep. Sometimes brain just won't shut down. Usually lays down at 10-10:30 . Up twice a night to urinate. If restless, may not get back to sleep. Sometimes he feels he urge to urinate within a few minutes of going back to bed after urinating. This may occur several times a night.  PT. Has been practicing sleep hygiene based on Internet research.   History Jex has a past medical history of Allergy, Arthritis, GERD (gastroesophageal reflux disease), and Raynaud's disease.   He has a past surgical history that includes Inguinal hernia repair and Colonoscopy.   His family history includes Arthritis in his brother and mother; Cancer in his father; Diabetes in his father; Stomach cancer in his father.He reports that he has quit smoking. He has quit using smokeless tobacco.  His smokeless tobacco use included chew. He reports current alcohol use. He reports that he does not use drugs.  Current Outpatient Medications on File Prior to Visit  Medication Sig Dispense Refill   amLODipine (NORVASC) 2.5 MG tablet TAKE 1 TABLET DAILY 30 tablet 0   aspirin 81 MG tablet Take 81 mg by mouth daily.     diclofenac (VOLTAREN) 75 MG EC tablet TAKE 1 TABLET TWICE DAILY AS NEEDED FOR MUSCLE AND JOINT PAIN 180 tablet 3   Fexofenadine-Pseudoephedrine (ALLEGRA-D 24 HOUR PO)      mometasone (NASONEX) 50 MCG/ACT nasal spray Place 2 sprays into the nose daily. 17 g 12   polyethylene glycol (MIRALAX /  GLYCOLAX) 17 g packet Take 17 g by mouth daily.     No current facility-administered medications on file prior to visit.    ROS Review of Systems  Constitutional:  Negative for fever.  Respiratory:  Negative for shortness of breath.   Cardiovascular:  Negative for chest pain.  Genitourinary:  Positive for frequency and urgency. Negative for penile pain and testicular pain.  Musculoskeletal:  Negative for arthralgias.  Skin:  Negative for rash.   Objective:  BP 126/73   Pulse 83   Temp 98 F (36.7 C)   Ht $R'6\' 5"'UG$  (1.956 m)   Wt 219 lb 3.2 oz (99.4 kg)   SpO2 97%   BMI 25.99 kg/m   BP Readings from Last 3 Encounters:  10/27/21 126/73  08/19/20 127/77  04/15/20 122/78    Wt Readings from Last 3 Encounters:  10/27/21 219 lb 3.2 oz (99.4 kg)  08/19/20 219 lb (99.3 kg)  04/15/20 217 lb (98.4 kg)     Physical Exam Constitutional:      General: He is not in acute distress.    Appearance: He is well-developed.  HENT:     Head: Normocephalic and atraumatic.     Right Ear: External ear normal.     Left Ear: External ear normal.     Nose: Nose normal.  Eyes:     Conjunctiva/sclera: Conjunctivae normal.     Pupils: Pupils are equal, round, and reactive to light.  Cardiovascular:  Rate and Rhythm: Normal rate and regular rhythm.     Heart sounds: Normal heart sounds. No murmur heard. Pulmonary:     Effort: Pulmonary effort is normal. No respiratory distress.     Breath sounds: Normal breath sounds. No wheezing or rales.  Abdominal:     Palpations: Abdomen is soft.     Tenderness: There is no abdominal tenderness.  Musculoskeletal:        General: Normal range of motion.     Cervical back: Normal range of motion and neck supple.  Skin:    General: Skin is warm and dry.  Neurological:     Mental Status: He is alert and oriented to person, place, and time.     Deep Tendon Reflexes: Reflexes are normal and symmetric.  Psychiatric:        Behavior: Behavior normal.         Thought Content: Thought content normal.        Judgment: Judgment normal.      Assessment & Plan:   Takashi was seen today for medical management of chronic issues.  Diagnoses and all orders for this visit:  Vitamin D deficiency -     VITAMIN D 25 Hydroxy (Vit-D Deficiency, Fractures)  Mixed hyperlipidemia -     Lipid panel  Screening for prostate cancer -     PSA, total and free  Hypotension due to drugs -     CBC with Differential/Platelet -     CMP14+EGFR  Primary insomnia  Benign prostatic hyperplasia with urinary frequency  Other orders -     pantoprazole (PROTONIX) 40 MG tablet; TAKE 1 TABLET TWICE DAILY FOR STOMACH. -     traZODone (DESYREL) 150 MG tablet; Use from 1/3 to 1 tablet nightly as needed for sleep. -     silodosin (RAPAFLO) 8 MG CAPS capsule; Take 1 capsule (8 mg total) by mouth daily. For urine flow (prostate)  Allergies as of 10/27/2021       Reactions   Lodine [etodolac] Other (See Comments)   Mouth sores         Medication List        Accurate as of October 27, 2021  5:05 PM. If you have any questions, ask your nurse or doctor.          STOP taking these medications    tamsulosin 0.4 MG Caps capsule Commonly known as: FLOMAX Stopped by: Claretta Fraise, MD       TAKE these medications    ALLEGRA-D 24 HOUR PO   amLODipine 2.5 MG tablet Commonly known as: NORVASC TAKE 1 TABLET DAILY   aspirin 81 MG tablet Take 81 mg by mouth daily.   diclofenac 75 MG EC tablet Commonly known as: VOLTAREN TAKE 1 TABLET TWICE DAILY AS NEEDED FOR MUSCLE AND JOINT PAIN   mometasone 50 MCG/ACT nasal spray Commonly known as: Nasonex Place 2 sprays into the nose daily.   pantoprazole 40 MG tablet Commonly known as: PROTONIX TAKE 1 TABLET TWICE DAILY FOR STOMACH. What changed: additional instructions Changed by: Claretta Fraise, MD   polyethylene glycol 17 g packet Commonly known as: MIRALAX / GLYCOLAX Take 17 g by mouth daily.    silodosin 8 MG Caps capsule Commonly known as: RAPAFLO Take 1 capsule (8 mg total) by mouth daily. For urine flow (prostate) Started by: Claretta Fraise, MD   traZODone 150 MG tablet Commonly known as: DESYREL Use from 1/3 to 1 tablet nightly as needed for sleep. Started  by: Claretta Fraise, MD        Meds ordered this encounter  Medications   pantoprazole (PROTONIX) 40 MG tablet    Sig: TAKE 1 TABLET TWICE DAILY FOR STOMACH.    Dispense:  180 tablet    Refill:  3   traZODone (DESYREL) 150 MG tablet    Sig: Use from 1/3 to 1 tablet nightly as needed for sleep.    Dispense:  30 tablet    Refill:  5   silodosin (RAPAFLO) 8 MG CAPS capsule    Sig: Take 1 capsule (8 mg total) by mouth daily. For urine flow (prostate)    Dispense:  180 capsule    Refill:  3      Follow-up: Return in about 6 months (around 04/26/2022), or if symptoms worsen or fail to improve.  Claretta Fraise, M.D.

## 2021-10-28 ENCOUNTER — Other Ambulatory Visit: Payer: 59

## 2021-10-28 ENCOUNTER — Telehealth: Payer: 59 | Admitting: *Deleted

## 2021-10-28 NOTE — Telephone Encounter (Signed)
Key: [id number] - PA Case ID: [id number] - Rx #: [id number]  Drug Silodosin 8MG  capsules Form [employer] ePA Form 2017 NCPDP Original Claim Info 75 TRANS FEE = 0.00PRIOR AUTH REQUIRED/INVALID; PA REQUESTS: [id number]: For [employer] Coupon Price of: $103.46 submit to BIN: [id number] PCN: CP Group: [id number] --Service provided at no cost and no switch fee to the pharmacy  Sent to plan

## 2021-10-29 LAB — CMP14+EGFR
ALT: 28 IU/L (ref 0–44)
AST: 29 IU/L (ref 0–40)
Albumin/Globulin Ratio: 2 (ref 1.2–2.2)
Albumin: 4.5 g/dL (ref 3.8–4.9)
Alkaline Phosphatase: 66 IU/L (ref 44–121)
BUN/Creatinine Ratio: 17 (ref 9–20)
BUN: 15 mg/dL (ref 6–24)
Bilirubin Total: 0.4 mg/dL (ref 0.0–1.2)
CO2: 25 mmol/L (ref 20–29)
Calcium: 9.3 mg/dL (ref 8.7–10.2)
Chloride: 104 mmol/L (ref 96–106)
Creatinine, Ser: 0.89 mg/dL (ref 0.76–1.27)
Globulin, Total: 2.2 g/dL (ref 1.5–4.5)
Glucose: 102 mg/dL — ABNORMAL HIGH (ref 70–99)
Potassium: 4.5 mmol/L (ref 3.5–5.2)
Sodium: 140 mmol/L (ref 134–144)
Total Protein: 6.7 g/dL (ref 6.0–8.5)
eGFR: 102 mL/min/{1.73_m2} (ref >59)

## 2021-10-29 LAB — LIPID PANEL
Chol/HDL Ratio: 4.7 ratio (ref 0.0–5.0)
Cholesterol, Total: 220 mg/dL — ABNORMAL HIGH (ref 100–199)
HDL: 47 mg/dL (ref >39)
LDL Chol Calc (NIH): 139 mg/dL — ABNORMAL HIGH (ref 0–99)
Triglycerides: 192 mg/dL — ABNORMAL HIGH (ref 0–149)
VLDL Cholesterol Cal: 34 mg/dL (ref 5–40)

## 2021-10-29 LAB — CBC WITH DIFFERENTIAL/PLATELET
Basophils Absolute: 0 10*3/uL (ref 0.0–0.2)
Basos: 0 %
EOS (ABSOLUTE): 0.1 10*3/uL (ref 0.0–0.4)
Eos: 2 %
Hematocrit: 44.4 % (ref 37.5–51.0)
Hemoglobin: 14.6 g/dL (ref 13.0–17.7)
Immature Grans (Abs): 0 10*3/uL (ref 0.0–0.1)
Immature Granulocytes: 0 %
Lymphocytes Absolute: 1.1 10*3/uL (ref 0.7–3.1)
Lymphs: 27 %
MCH: 28.8 pg (ref 26.6–33.0)
MCHC: 32.9 g/dL (ref 31.5–35.7)
MCV: 88 fL (ref 79–97)
Monocytes Absolute: 0.4 10*3/uL (ref 0.1–0.9)
Monocytes: 9 %
Neutrophils Absolute: 2.5 10*3/uL (ref 1.4–7.0)
Neutrophils: 62 %
Platelets: 171 10*3/uL (ref 150–450)
RBC: 5.07 x10E6/uL (ref 4.14–5.80)
RDW: 12.6 % (ref 11.6–15.4)
WBC: 4.2 10*3/uL (ref 3.4–10.8)

## 2021-10-29 LAB — PSA, TOTAL AND FREE
PSA, Free Pct: 43.8 %
PSA, Free: 0.35 ng/mL
Prostate Specific Ag, Serum: 0.8 ng/mL (ref 0.0–4.0)

## 2021-10-29 LAB — VITAMIN D 25 HYDROXY (VIT D DEFICIENCY, FRACTURES): Vit D, 25-Hydroxy: 36 ng/mL (ref 30.0–100.0)

## 2021-11-08 ENCOUNTER — Other Ambulatory Visit: Payer: Self-pay | Admitting: Family Medicine

## 2021-11-08 DIAGNOSIS — I7300 Raynaud's syndrome without gangrene: Secondary | ICD-10-CM

## 2021-11-08 MED ORDER — ALFUZOSIN HCL ER 10 MG PO TB24: 10.0000 mg | ORAL_TABLET | Freq: Every day | ORAL | 1 refills | Status: DC

## 2021-11-08 NOTE — Telephone Encounter (Signed)
Patient aware and would like medication sent to [place]. Medication re sent.

## 2021-11-08 NOTE — Telephone Encounter (Signed)
Deniedon November 15 This request has not been approved. This request is denied because we did not receive enough information from your provider. We contacted your provider but did not receive a response within the required time limit. Please follow up with your provider. Based on the information submitted for review, you did not meet our guideline rules for the requested drug. In order for your request to be approved, your provider would need to show that you have met the guideline rules below. The details below are written in medical language. If you have questions, please contact your provider. In some cases, the requested medication or alternatives offered may have additional approval requirements. Our guideline named SILODOSIN requires the following rule(s) be met for approval:A. You have tried and failed, have a contraindication or intolerance to at least TWO of the following medications: tamsulosin, doxazosin, alfuzosin, or terazosin B. You do not have severe renal impairment (CCr < 30 mL/min) C. You do not have severe hepatic impairment (Child-Pugh score > 10)Your doctor told us that your diagnosis is benign prostatic hyperplasia (BPH: a condition of the prostate) and requested this medication for treatment. We made outreach attempts regarding your treatment history, but we did not receive the requested clinical information. We do not have information showing that you meet the conditions listed above. This is why your request is denied. Please work with your doctor to use a different medication or get [place] more information if it will allow [place] to approve this request. A written notification letter will follow with additional details.

## 2021-11-08 NOTE — Telephone Encounter (Signed)
Patient got a letter stating we haven't sent in the right information to get Silodosin 8 mg approved. He has enough medication for one more night and will need something tomorrow night. Please call patient.

## 2021-11-08 NOTE — Telephone Encounter (Signed)
I sent alfuzosin to his mail order as a replacement. He has to try it before we can appeal for the other. It is a good medication and may work well for him.

## 2022-01-03 ENCOUNTER — Encounter: Payer: Self-pay | Admitting: Nurse Practitioner

## 2022-01-03 ENCOUNTER — Ambulatory Visit (INDEPENDENT_AMBULATORY_CARE_PROVIDER_SITE_OTHER): Payer: 59 | Admitting: Nurse Practitioner

## 2022-01-03 DIAGNOSIS — J0110 Acute frontal sinusitis, unspecified: Secondary | ICD-10-CM | POA: Diagnosis not present

## 2022-01-03 MED ORDER — DM-GUAIFENESIN ER 30-600 MG PO TB12: 1.0000 | ORAL_TABLET | Freq: Two times a day (BID) | ORAL | 0 refills | Status: DC

## 2022-01-03 MED ORDER — AMOXICILLIN-POT CLAVULANATE 875-125 MG PO TABS: 1.0000 | ORAL_TABLET | Freq: Two times a day (BID) | ORAL | 0 refills | Status: DC

## 2022-01-03 NOTE — Progress Notes (Signed)
° °  Virtual Visit  Note Due to COVID-19 pandemic this visit was conducted virtually. This visit type was conducted due to national recommendations for restrictions regarding the COVID-19 Pandemic (e.g. social distancing, sheltering in place) in an effort to limit this patient's exposure and mitigate transmission in our community. All issues noted in this document were discussed and addressed.  A physical exam was not performed with this format.  I connected with Danny Snyder on 01/03/22 at 09 :00  telephone and verified that I am speaking with the correct person using two identifiers. Danny Snyder is currently located at home during visit. The provider, Ivy Lynn, NP is located in their office at time of visit.  I discussed the limitations, risks, security and privacy concerns of performing an evaluation and management service by telephone and the availability of in person appointments. I also discussed with the patient that there may be a patient responsible charge related to this service. The patient expressed understanding and agreed to proceed.   History and Present Illness:  Sinusitis The current episode started in the past 7 days. The problem has been gradually worsening since onset. There has been no fever. The pain is moderate. Associated symptoms include congestion, coughing, headaches, sinus pressure, sneezing and a sore throat. Pertinent negatives include no chills or shortness of breath. Treatments tried: Alka-Seltzer. The treatment provided no relief.     Review of Systems  Constitutional:  Negative for chills and fever.  HENT:  Positive for congestion, sinus pressure, sinus pain, sneezing and sore throat.   Eyes: Negative.   Respiratory:  Positive for cough. Negative for shortness of breath.   Gastrointestinal:  Negative for nausea.  Skin:  Negative for rash.  Neurological:  Positive for headaches.  All other systems reviewed and are  negative.   Observations/Objective: Televisit patient not in distress  Assessment and Plan: Take meds as prescribed - Use a cool mist humidifier  -Use saline nose sprays frequently -Force fluids -For fever or aches or pains- take Tylenol or ibuprofen. -Augmentin 875-125 mg tablet by mouth twice daily. -Guaifenesin for cough and congestion. -If symptoms do not improve, she may need to be COVID tested to rule this out   Follow Up Instructions: Follow-up with unresolved symptoms.    I discussed the assessment and treatment plan with the patient. The patient was provided an opportunity to ask questions and all were answered. The patient agreed with the plan and demonstrated an understanding of the instructions.   The patient was advised to call back or seek an in-person evaluation if the symptoms worsen or if the condition fails to improve as anticipated.  The above assessment and management plan was discussed with the patient. The patient verbalized understanding of and has agreed to the management plan. Patient is aware to call the clinic if symptoms persist or worsen. Patient is aware when to return to the clinic for a follow-up visit. Patient educated on when it is appropriate to go to the emergency department.   Time call ended:  9:11 am   I provided 11 minutes of  non face-to-face time during this encounter.    Ivy Lynn, NP

## 2022-02-07 ENCOUNTER — Other Ambulatory Visit: Payer: Self-pay | Admitting: Family Medicine

## 2022-02-07 DIAGNOSIS — I7300 Raynaud's syndrome without gangrene: Secondary | ICD-10-CM

## 2022-03-07 ENCOUNTER — Other Ambulatory Visit: Payer: Self-pay | Admitting: Family Medicine

## 2022-03-07 DIAGNOSIS — I7300 Raynaud's syndrome without gangrene: Secondary | ICD-10-CM

## 2022-03-09 ENCOUNTER — Telehealth: Payer: Self-pay | Admitting: Family Medicine

## 2022-03-09 DIAGNOSIS — I7300 Raynaud's syndrome without gangrene: Secondary | ICD-10-CM

## 2022-03-09 MED ORDER — AMLODIPINE BESYLATE 2.5 MG PO TABS: 2.5000 mg | ORAL_TABLET | Freq: Every day | ORAL | 0 refills | Status: DC

## 2022-03-09 NOTE — Telephone Encounter (Signed)
Pt aware refill sent on Amlodipine, Pantoprazole was sent in on 10/27/21 for a 3 mos supply with 3 refills, pharmacy should have refills on file ?

## 2022-03-09 NOTE — Telephone Encounter (Signed)
?  Prescription Request ? ?03/09/2022 ? ?Is this a "Controlled Substance" medicine? NO ? ?Have you seen your PCP in the last 2 weeks? NO ? ?If YES, route message to pool  -  If NO, patient needs to be scheduled for appointment. ? ?What is the name of the medication or equipment? Amlodipine 2.5 mg and Pantoprazole 40 mg. Patient has appt for his 6 month 5-10. Medications were refused ? ?Have you contacted your pharmacy to request a refill? YES  ? ?Which pharmacy would you like this sent to? [facility]. ? ? ?Patient notified that their request is being sent to the clinical staff for review and that they should receive a response within 2 business days.  ?  ?

## 2022-04-25 ENCOUNTER — Other Ambulatory Visit: Payer: Self-pay | Admitting: Family Medicine

## 2022-04-27 ENCOUNTER — Ambulatory Visit: Payer: 59 | Admitting: Family Medicine

## 2022-04-27 ENCOUNTER — Encounter: Payer: Self-pay | Admitting: Family Medicine

## 2022-04-27 VITALS — BP 119/68 | HR 66 | Temp 97.6°F | Ht 77.0 in | Wt 210.0 lb

## 2022-04-27 DIAGNOSIS — I952 Hypotension due to drugs: Secondary | ICD-10-CM

## 2022-04-27 DIAGNOSIS — Z23 Encounter for immunization: Secondary | ICD-10-CM | POA: Diagnosis not present

## 2022-04-27 DIAGNOSIS — I7300 Raynaud's syndrome without gangrene: Secondary | ICD-10-CM | POA: Diagnosis not present

## 2022-04-27 DIAGNOSIS — E782 Mixed hyperlipidemia: Secondary | ICD-10-CM

## 2022-04-27 DIAGNOSIS — R351 Nocturia: Secondary | ICD-10-CM | POA: Diagnosis not present

## 2022-04-27 DIAGNOSIS — N3289 Other specified disorders of bladder: Secondary | ICD-10-CM

## 2022-04-27 MED ORDER — MIRABEGRON ER 50 MG PO TB24: 50.0000 mg | ORAL_TABLET | Freq: Every day | ORAL | 1 refills | Status: DC

## 2022-04-27 MED ORDER — ALFUZOSIN HCL ER 10 MG PO TB24: 10.0000 mg | ORAL_TABLET | Freq: Every day | ORAL | 3 refills | Status: DC

## 2022-04-27 MED ORDER — HYOSCYAMINE SULFATE 0.125 MG SL SUBL: 0.1250 mg | SUBLINGUAL_TABLET | Freq: Four times a day (QID) | SUBLINGUAL | 2 refills | Status: AC | PRN

## 2022-04-27 MED ORDER — AMLODIPINE BESYLATE 2.5 MG PO TABS: 2.5000 mg | ORAL_TABLET | Freq: Every day | ORAL | 2 refills | Status: DC

## 2022-04-27 MED ORDER — TRAZODONE HCL 150 MG PO TABS: ORAL_TABLET | ORAL | 3 refills | Status: DC

## 2022-04-27 NOTE — Progress Notes (Signed)
? ?Subjective:  ?Patient ID: Danny Snyder, male    DOB: 01-23-1966  Age: 56 y.o. MRN: [id number] ? ?CC: Medical Management of Chronic Issues ? ? ?HPI ?Danny Snyder presents for frequency and dysuria. [name] Up 6-7 times  a few nights ago. Usually up 2-3 times. saUp once, can get back to sleep. Up 2-3 or more times and he can't get back to sleep.Flow is strong.  ? ?Patient in for follow-up of GERD. Currently asymptomatic taking  PPI daily. There is no chest pain or heartburn. No hematemesis and no melena. No dysphagia or choking. Onset is remote. Progression is stable. Complicating factors, none. ? ? ? presents for  follow-up of hypertension. Patient has no history of headache chest pain or shortness of breath or recent cough. Patient also denies symptoms of TIA such as focal numbness or weakness. Patient denies side effects from medication. States taking it regularly. ? ? ? ? ?  04/27/2022  ?  8:02 AM 10/27/2021  ?  3:59 PM 08/19/2020  ?  7:56 AM  ?Depression screen PHQ 2/9  ?Decreased Interest 0 0 0  ?Down, Depressed, Hopeless 0 0 0  ?PHQ - 2 Score 0 0 0  ? ? ?History ?Danny Snyder has a past medical history of Allergy, Arthritis, GERD (gastroesophageal reflux disease), and Raynaud's disease.  ? ?He has a past surgical history that includes Inguinal hernia repair and Colonoscopy.  ? ?His family history includes Arthritis in his brother and mother; Cancer in his father; Diabetes in his father; Stomach cancer in his father.He reports that he has quit smoking. He has quit using smokeless tobacco.  His smokeless tobacco use included chew. He reports current alcohol use. He reports that he does not use drugs. ? ? ? ?ROS ?Review of Systems  ?Constitutional:  Negative for fever.  ?HENT:  Positive for congestion.   ?Respiratory:  Negative for shortness of breath.   ?Cardiovascular:  Negative for chest pain.  ?Genitourinary:  Positive for difficulty urinating and frequency.  ?Musculoskeletal:  Negative  for arthralgias.  ?Skin:  Negative for rash.  ?Allergic/Immunologic: Positive for environmental allergies.  ?Psychiatric/Behavioral:  Positive for sleep disturbance.   ? ?Objective:  ?BP 119/68   Pulse 66   Temp 97.6 ?F (36.4 ?C)   Ht $R'6\' 5"'Gh$  (1.956 m)   Wt 210 lb (95.3 kg)   SpO2 97%   BMI 24.90 kg/m?  ? ?BP Readings from Last 3 Encounters:  ?04/27/22 119/68  ?10/27/21 126/73  ?08/19/20 127/77  ? ? ?Wt Readings from Last 3 Encounters:  ?04/27/22 210 lb (95.3 kg)  ?10/27/21 219 lb 3.2 oz (99.4 kg)  ?08/19/20 219 lb (99.3 kg)  ? ? ? ?Physical Exam ?Vitals reviewed.  ?Constitutional:   ?   Appearance: He is well-developed.  ?HENT:  ?   Head: Normocephalic and atraumatic.  ?   Right Ear: External ear normal.  ?   Left Ear: External ear normal.  ?   Mouth/Throat:  ?   Pharynx: No oropharyngeal exudate or posterior oropharyngeal erythema.  ?Eyes:  ?   Pupils: Pupils are equal, round, and reactive to light.  ?Cardiovascular:  ?   Rate and Rhythm: Normal rate and regular rhythm.  ?   Heart sounds: No murmur heard. ?Pulmonary:  ?   Effort: No respiratory distress.  ?   Breath sounds: Normal breath sounds.  ?Musculoskeletal:  ?   Cervical back: Normal range of motion and neck supple.  ?Neurological:  ?  Mental Status: He is alert and oriented to person, place, and time.  ? ? ? ? ?Assessment & Plan:  ? ?Danny Snyder was seen today for medical management of chronic issues. ? ?Diagnoses and all orders for this visit: ? ?Mixed hyperlipidemia ?-     Lipid panel ? ?Hypotension due to drugs ?-     CBC with Differential/Platelet ?-     CMP14+EGFR ? ?Raynaud's disease without gangrene ?-     amLODipine (NORVASC) 2.5 MG tablet; Take 1 tablet (2.5 mg total) by mouth daily. ? ?Bladder spasms ?-     Ambulatory referral to Urology ? ?Nocturia ?-     Ambulatory referral to Urology ? ?Other orders ?-     alfuzosin (UROXATRAL) 10 MG 24 hr tablet; Take 1 tablet (10 mg total) by mouth daily with breakfast. ?-     traZODone (DESYREL) 150 MG  tablet; TAKE 1/3 TO 1 TABLET NIGHTLY AS NEEDED FOR SLEEP-Must keep appt on 05/10 ?-     mirabegron ER (MYRBETRIQ) 50 MG TB24 tablet; Take 1 tablet (50 mg total) by mouth daily. For bladder spasms ?-     hyoscyamine (LEVSIN SL) 0.125 MG SL tablet; Place 1 tablet (0.125 mg total) under the tongue every 6 (six) hours as needed. ? ? ? ? ? ? ?I have discontinued Danny Snyder "Danny Snyder"'s amoxicillin-clavulanate and dextromethorphan-guaiFENesin. I am also having him maintain his aspirin, Fexofenadine-Pseudoephedrine (ALLEGRA-D 24 HOUR PO), mometasone, polyethylene glycol, diclofenac, pantoprazole, alfuzosin, amLODipine, traZODone, mirabegron ER, and hyoscyamine. ? ?Allergies as of 04/27/2022   ? ?   Reactions  ? Lodine [etodolac] Other (See Comments)  ? Mouth sores   ? ?  ? ?  ?Medication List  ?  ? ?  ? Accurate as of Apr 27, 2022  8:35 AM. If you have any questions, ask your nurse or doctor.  ?  ?  ? ?  ? ?STOP taking these medications   ? ?amoxicillin-clavulanate 875-125 MG tablet ?Commonly known as: AUGMENTIN ?Stopped by: Claretta Fraise, MD ?  ?dextromethorphan-guaiFENesin 30-600 MG 12hr tablet ?Commonly known as: [place] DM ?Stopped by: Claretta Fraise, MD ?  ? ?  ? ?TAKE these medications   ? ?alfuzosin 10 MG 24 hr tablet ?Commonly known as: UROXATRAL ?Take 1 tablet (10 mg total) by mouth daily with breakfast. ?  ?ALLEGRA-D 24 HOUR PO ?  ?amLODipine 2.5 MG tablet ?Commonly known as: NORVASC ?Take 1 tablet (2.5 mg total) by mouth daily. ?  ?aspirin 81 MG tablet ?Take 81 mg by mouth daily. ?  ?diclofenac 75 MG EC tablet ?Commonly known as: VOLTAREN ?TAKE 1 TABLET TWICE DAILY AS NEEDED FOR MUSCLE AND JOINT PAIN ?  ?hyoscyamine 0.125 MG SL tablet ?Commonly known as: LEVSIN SL ?Place 1 tablet (0.125 mg total) under the tongue every 6 (six) hours as needed. ?Started by: Claretta Fraise, MD ?  ?mirabegron ER 50 MG Tb24 tablet ?Commonly known as: Myrbetriq ?Take 1 tablet (50 mg total) by mouth daily. For bladder  spasms ?Started by: Claretta Fraise, MD ?  ?mometasone 50 MCG/ACT nasal spray ?Commonly known as: Nasonex ?Place 2 sprays into the nose daily. ?  ?pantoprazole 40 MG tablet ?Commonly known as: PROTONIX ?TAKE 1 TABLET TWICE DAILY FOR STOMACH. ?  ?polyethylene glycol 17 g packet ?Commonly known as: MIRALAX / GLYCOLAX ?Take 17 g by mouth daily. ?  ?traZODone 150 MG tablet ?Commonly known as: DESYREL ?TAKE 1/3 TO 1 TABLET NIGHTLY AS NEEDED FOR SLEEP-Must keep appt on 05/10 ?  ? ?  ? ?  Patient has had chronic problems with nocturia and frequency of urination.  It has not responded adequately to multiple trials of medicine including the current medicine alfazosin.  He is also tried [employer] and Rapaflo as well as tamsulosin.  There seems to be an element of bladder spasm from his history.  This was not adequately relieved by the previous trials.  As result I want to try him on the stronger Myrbetriq regimen including the alfazosin.  Hopefully urology can shed some light on this perhaps with urodynamics or other evaluation. ? ?Follow-up: Return in about 6 months (around 10/28/2022). ? ?Claretta Fraise, M.D. ?

## 2022-04-28 LAB — CBC WITH DIFFERENTIAL/PLATELET
Basophils Absolute: 0 10*3/uL (ref 0.0–0.2)
Basos: 0 %
EOS (ABSOLUTE): 0.1 10*3/uL (ref 0.0–0.4)
Eos: 3 %
Hematocrit: 44.3 % (ref 37.5–51.0)
Hemoglobin: 14.8 g/dL (ref 13.0–17.7)
Immature Grans (Abs): 0 10*3/uL (ref 0.0–0.1)
Immature Granulocytes: 0 %
Lymphocytes Absolute: 1.2 10*3/uL (ref 0.7–3.1)
Lymphs: 27 %
MCH: 29.1 pg (ref 26.6–33.0)
MCHC: 33.4 g/dL (ref 31.5–35.7)
MCV: 87 fL (ref 79–97)
Monocytes Absolute: 0.4 10*3/uL (ref 0.1–0.9)
Monocytes: 9 %
Neutrophils Absolute: 2.7 10*3/uL (ref 1.4–7.0)
Neutrophils: 61 %
Platelets: 175 10*3/uL (ref 150–450)
RBC: 5.09 x10E6/uL (ref 4.14–5.80)
RDW: 13.3 % (ref 11.6–15.4)
WBC: 4.4 10*3/uL (ref 3.4–10.8)

## 2022-04-28 LAB — CMP14+EGFR
ALT: 31 IU/L (ref 0–44)
AST: 22 IU/L (ref 0–40)
Albumin/Globulin Ratio: 2 (ref 1.2–2.2)
Albumin: 4.5 g/dL (ref 3.8–4.9)
Alkaline Phosphatase: 59 IU/L (ref 44–121)
BUN/Creatinine Ratio: 13 (ref 9–20)
BUN: 14 mg/dL (ref 6–24)
Bilirubin Total: <0.2 mg/dL (ref 0.0–1.2)
CO2: 22 mmol/L (ref 20–29)
Calcium: 9.6 mg/dL (ref 8.7–10.2)
Chloride: 105 mmol/L (ref 96–106)
Creatinine, Ser: 1.04 mg/dL (ref 0.76–1.27)
Globulin, Total: 2.2 g/dL (ref 1.5–4.5)
Glucose: 109 mg/dL — ABNORMAL HIGH (ref 70–99)
Potassium: 4.6 mmol/L (ref 3.5–5.2)
Sodium: 141 mmol/L (ref 134–144)
Total Protein: 6.7 g/dL (ref 6.0–8.5)
eGFR: 85 mL/min/{1.73_m2} (ref >59)

## 2022-04-28 LAB — LIPID PANEL
Chol/HDL Ratio: 4.2 ratio (ref 0.0–5.0)
Cholesterol, Total: 194 mg/dL (ref 100–199)
HDL: 46 mg/dL (ref >39)
LDL Chol Calc (NIH): 109 mg/dL — ABNORMAL HIGH (ref 0–99)
Triglycerides: 224 mg/dL — ABNORMAL HIGH (ref 0–149)
VLDL Cholesterol Cal: 39 mg/dL (ref 5–40)

## 2022-04-28 NOTE — Addendum Note (Signed)
Addended by: Baldomero Lamy B on: 04/28/2022 09:01 AM ? ? Modules accepted: Orders ? ?

## 2022-04-29 ENCOUNTER — Telehealth: Payer: Self-pay | Admitting: *Deleted

## 2022-04-29 NOTE — Telephone Encounter (Signed)
[facility] - PA Case ID: [id number] - Rx #: [id number] Need help? Call us at [phone] ?Status ?Sent to [facility] ?Drug ?Myrbetriq '50MG'$  er tablets ?

## 2022-05-02 ENCOUNTER — Other Ambulatory Visit: Payer: Self-pay | Admitting: Family Medicine

## 2022-05-02 MED ORDER — TOLTERODINE TARTRATE ER 4 MG PO CP24: 4.0000 mg | ORAL_CAPSULE | Freq: Every day | ORAL | 3 refills | Status: DC

## 2022-05-02 NOTE — Telephone Encounter (Signed)
Please tell Danny Snyder insurance wouldn't cover myrbetriq, but there is one that may work as well that they do cover. I sent in tolterodine for him. ?

## 2022-05-02 NOTE — Telephone Encounter (Signed)
Patient aware.

## 2022-05-02 NOTE — Telephone Encounter (Signed)
[facility]?, the utilization review entity for [employer], ?received a request for coverage of MYRBETRIQ '50MG'$  TAB for you. We?ve denied the request for the ?following reason(s): ?Coverage for this medication is denied for the following reason(s). We reviewed the information we ?received about your condition and circumstances. We used the plan approved policy when making this ?decision. The policy states that this medication may be approved when: ?- The member has a clinical condition or needs a specific dosage form for which there is no alternative on ?the formulary OR ?- The listed formulary alternatives are not recommended based on published guidelines or clinical ?literature OR ?- The formulary alternatives will likely be ineffective or less effective for the member OR ?- The formulary alternatives will likely cause an adverse effect OR ?- The member is unable to take the required number of formulary alternatives for the given diagnosis ?due to a trial and inadequate treatment response or contraindication OR ?- The member has tried and failed the required number of formulary alternatives. ?Based on the policy and the information we have, your request is denied. We did not receive any ?documentation that you meet any of the criteria outlined above. ?Formulary alternative(s) are darifenacin extended-release, fesoterodine, oxybutynin, oxybutynin ?extended-release, solifenacin, tolterodine, tolterodine extended-release, trospium, trospium extendedrelease. Requirement: 3 in a class with 3 or more alternatives, 2 in a class with 2 alternatives, or 1 in a ?class with only 1 alternative. Please refer to your plan documents for a complete list of alternatives. ?Note: Formulary alternatives may require a prior authorization. Your prescriber will be responsible for ?determining what alternative is appropriate for you ?

## 2022-05-20 ENCOUNTER — Ambulatory Visit (INDEPENDENT_AMBULATORY_CARE_PROVIDER_SITE_OTHER): Payer: 59 | Admitting: Nurse Practitioner

## 2022-05-20 ENCOUNTER — Encounter: Payer: Self-pay | Admitting: Nurse Practitioner

## 2022-05-20 DIAGNOSIS — J40 Bronchitis, not specified as acute or chronic: Secondary | ICD-10-CM

## 2022-05-20 MED ORDER — PREDNISONE 20 MG PO TABS: 40.0000 mg | ORAL_TABLET | Freq: Every day | ORAL | 0 refills | Status: AC

## 2022-05-20 MED ORDER — AZITHROMYCIN 250 MG PO TABS: ORAL_TABLET | ORAL | 0 refills | Status: DC

## 2022-05-20 NOTE — Patient Instructions (Signed)

## 2022-05-20 NOTE — Progress Notes (Signed)
Virtual Visit  Note Due to COVID-19 pandemic this visit was conducted virtually. This visit type was conducted due to national recommendations for restrictions regarding the COVID-19 Pandemic (e.g. social distancing, sheltering in place) in an effort to limit this patient's exposure and mitigate transmission in our community. All issues noted in this document were discussed and addressed.  A physical exam was not performed with this format.  I connected with Danny Snyder on 05/20/22 at 8:23 by telephone and verified that I am speaking with the correct person using two identifiers. Danny Snyder is currently located  in his car and no one is currently with him during visit. The provider, Mary-Margaret Hassell Done, FNP is located in their office at time of visit.  I discussed the limitations, risks, security and privacy concerns of performing an evaluation and management service by telephone and the availability of in person appointments. I also discussed with the patient that there may be a patient responsible charge related to this service. The patient expressed understanding and agreed to proceed.   History and Present Illness:  URI  This is a new problem. The current episode started 1 to 4 weeks ago. The problem has been waxing and waning. The maximum temperature recorded prior to his arrival was 100.4 - 100.9 F. The fever has been present for 1 to 2 days (the beginning). Associated symptoms include congestion, coughing, headaches, rhinorrhea, sinus pain and a sore throat. He has tried acetaminophen (OTC cough meds) for the symptoms. The treatment provided mild relief.     Review of Systems  HENT:  Positive for congestion, rhinorrhea, sinus pain and sore throat.   Respiratory:  Positive for cough.   Neurological:  Positive for headaches.    Observations/Objective: Alert and oriented- answers all questions appropriately No distress Deep tight cough noted Voice raspy  Assessment and  Plan: Danny Snyder in today with chief complaint of URI   1. Bronchitis 1. Take meds as prescribed 2. Use a cool mist humidifier especially during the winter months and when heat has been humid. 3. Use saline nose sprays frequently 4. Saline irrigations of the nose can be very helpful if done frequently.  * 4X daily for 1 week*  * Use of a nettie pot can be helpful with this. Follow directions with this* 5. Drink plenty of fluids 6. Keep thermostat turn down low 7.For any cough or congestion- mucinex 8. For fever or aces or pains- take tylenol or ibuprofen appropriate for age and weight.  * for fevers greater than 101 orally you may alternate ibuprofen and tylenol every  3 hours.    Meds ordered this encounter  Medications   azithromycin (ZITHROMAX Z-PAK) 250 MG tablet    Sig: As directed    Dispense:  6 tablet    Refill:  0    Order Specific Question:   Supervising Provider    Answer:   Caryl Pina A [[id number]]   predniSONE (DELTASONE) 20 MG tablet    Sig: Take 2 tablets (40 mg total) by mouth daily with breakfast for 5 days. 2 po daily for 5 days    Dispense:  10 tablet    Refill:  0    Order Specific Question:   Supervising Provider    Answer:   Caryl Pina A [[id number]]      Follow Up Instructions: prn    I discussed the assessment and treatment plan with the patient. The patient was provided an opportunity to ask questions  and all were answered. The patient agreed with the plan and demonstrated an understanding of the instructions.   The patient was advised to call back or seek an in-person evaluation if the symptoms worsen or if the condition fails to improve as anticipated.  The above assessment and management plan was discussed with the patient. The patient verbalized understanding of and has agreed to the management plan. Patient is aware to call the clinic if symptoms persist or worsen. Patient is aware when to return to the clinic for a follow-up  visit. Patient educated on when it is appropriate to go to the emergency department.   Time call ended:  8:35  I provided 12 minutes of  non face-to-face time during this encounter.    Mary-Margaret Hassell Done, FNP

## 2022-07-04 ENCOUNTER — Other Ambulatory Visit: Payer: Self-pay | Admitting: Family Medicine

## 2022-08-09 ENCOUNTER — Encounter: Payer: Self-pay | Admitting: Family Medicine

## 2022-08-09 ENCOUNTER — Telehealth (INDEPENDENT_AMBULATORY_CARE_PROVIDER_SITE_OTHER): Payer: 59 | Admitting: Family Medicine

## 2022-08-09 DIAGNOSIS — J301 Allergic rhinitis due to pollen: Secondary | ICD-10-CM

## 2022-08-09 DIAGNOSIS — J0100 Acute maxillary sinusitis, unspecified: Secondary | ICD-10-CM | POA: Diagnosis not present

## 2022-08-09 MED ORDER — PSEUDOEPHEDRINE-GUAIFENESIN ER 120-1200 MG PO TB12: 1.0000 | ORAL_TABLET | Freq: Two times a day (BID) | ORAL | 0 refills | Status: DC

## 2022-08-09 MED ORDER — AMOXICILLIN-POT CLAVULANATE 875-125 MG PO TABS: 1.0000 | ORAL_TABLET | Freq: Two times a day (BID) | ORAL | 0 refills | Status: DC

## 2022-08-09 NOTE — Progress Notes (Signed)
Subjective:    Patient ID: Danny Snyder, male    DOB: 1966/08/08, 56 y.o.   MRN: [id number]   HPI: Danny Snyder is a 56 y.o. male presenting for allergy turning to sinus. Stuffy, swimmy headed. Mucinex DM not helping. Hacking cough. Clear phlegm. Posterior drainage. No fever. Having HA and facial pressure. Onset 7-10 days ago.       04/27/2022    8:02 AM 10/27/2021    3:59 PM 08/19/2020    7:56 AM 01/08/2020    8:17 AM 10/17/2019    9:14 AM  Depression screen PHQ 2/9  Decreased Interest 0 0 0 0 0  Down, Depressed, Hopeless 0 0 0 0 0  PHQ - 2 Score 0 0 0 0 0     Relevant past medical, surgical, family and social history reviewed and updated as indicated.  Interim medical history since our last visit reviewed. Allergies and medications reviewed and updated.  ROS:  Review of Systems  Constitutional:  Negative for activity change, appetite change, chills and fever.  HENT:  Positive for congestion, postnasal drip, rhinorrhea and sinus pressure. Negative for ear discharge, ear pain, hearing loss, nosebleeds, sneezing and trouble swallowing.   Respiratory:  Negative for chest tightness and shortness of breath.   Cardiovascular:  Negative for chest pain and palpitations.  Skin:  Negative for rash.     Social History   Tobacco Use  Smoking Status Former  Smokeless Tobacco Former   Types: Chew       Objective:     Wt Readings from Last 3 Encounters:  04/27/22 210 lb (95.3 kg)  10/27/21 219 lb 3.2 oz (99.4 kg)  08/19/20 219 lb (99.3 kg)     Exam deferred. Pt. Harboring due to COVID 19. Phone visit performed.   Assessment & Plan:   1. Acute maxillary sinusitis, recurrence not specified   2. Seasonal allergic rhinitis due to pollen     Meds ordered this encounter  Medications   Pseudoephedrine-Guaifenesin [phone] MG TB12    Sig: Take 1 tablet by mouth 2 (two) times daily. For congestion    Dispense:  20 tablet    Refill:  0   amoxicillin-clavulanate  (AUGMENTIN) 875-125 MG tablet    Sig: Take 1 tablet by mouth 2 (two) times daily. Take all of this medication    Dispense:  20 tablet    Refill:  0    No orders of the defined types were placed in this encounter.     Diagnoses and all orders for this visit:  Acute maxillary sinusitis, recurrence not specified  Seasonal allergic rhinitis due to pollen  Other orders -     Pseudoephedrine-Guaifenesin [phone] MG TB12; Take 1 tablet by mouth 2 (two) times daily. For congestion -     amoxicillin-clavulanate (AUGMENTIN) 875-125 MG tablet; Take 1 tablet by mouth 2 (two) times daily. Take all of this medication    Virtual Visit via telephone Note  I discussed the limitations, risks, security and privacy concerns of performing an evaluation and management service by telephone and the availability of in person appointments. The patient was identified with two identifiers. Pt.expressed understanding and agreed to proceed. Pt. Is at home. Dr. Livia Snellen is in his office.  Follow Up Instructions:   I discussed the assessment and treatment plan with the patient. The patient was provided an opportunity to ask questions and all were answered. The patient agreed with the plan and demonstrated an understanding of the instructions.  The patient was advised to call back or seek an in-person evaluation if the symptoms worsen or if the condition fails to improve as anticipated.   Total minutes including chart review and phone contact time: 8   Follow up plan: Return if symptoms worsen or fail to improve.  Claretta Fraise, MD [place]

## 2022-08-12 ENCOUNTER — Other Ambulatory Visit: Payer: Self-pay | Admitting: Family Medicine

## 2022-08-15 ENCOUNTER — Other Ambulatory Visit: Payer: Self-pay | Admitting: Family Medicine

## 2022-08-15 ENCOUNTER — Telehealth: Payer: Self-pay | Admitting: Family Medicine

## 2022-08-15 MED ORDER — PREDNISONE 10 MG PO TABS: ORAL_TABLET | ORAL | 0 refills | Status: DC

## 2022-08-15 NOTE — Telephone Encounter (Signed)
  Incoming Patient Call  08/15/2022  What symptoms do you have? Still having congestion,weak,cough. Biggest thing is he has no energy  How long have you been sick? 3 weeks  Have you been seen for this problem? YES televisit with [facility] on 8-22  If your provider decides to give you a prescription, which pharmacy would you like for it to be sent to? [facility]   Patient informed that this information will be sent to the clinical staff for review and that they should receive a follow up call.

## 2022-08-15 NOTE — Telephone Encounter (Signed)
Patient aware.

## 2022-08-15 NOTE — Telephone Encounter (Signed)
I want hiim to take a prednisone taper. Scrip sent

## 2022-09-12 ENCOUNTER — Encounter: Payer: Self-pay | Admitting: Nurse Practitioner

## 2022-09-12 ENCOUNTER — Ambulatory Visit (INDEPENDENT_AMBULATORY_CARE_PROVIDER_SITE_OTHER): Payer: 59 | Admitting: Nurse Practitioner

## 2022-09-12 DIAGNOSIS — R051 Acute cough: Secondary | ICD-10-CM

## 2022-09-12 DIAGNOSIS — U071 COVID-19: Secondary | ICD-10-CM

## 2022-09-12 MED ORDER — MOLNUPIRAVIR EUA 200MG CAPSULE: 4.0000 | ORAL_CAPSULE | Freq: Two times a day (BID) | ORAL | 0 refills | Status: AC

## 2022-09-12 MED ORDER — BENZONATATE 100 MG PO CAPS: 100.0000 mg | ORAL_CAPSULE | Freq: Three times a day (TID) | ORAL | 0 refills | Status: DC | PRN

## 2022-09-12 MED ORDER — ACETAMINOPHEN 500 MG PO TABS: 500.0000 mg | ORAL_TABLET | Freq: Four times a day (QID) | ORAL | 0 refills | Status: AC | PRN

## 2022-09-12 NOTE — Patient Instructions (Signed)

## 2022-09-12 NOTE — Progress Notes (Signed)
   Virtual Visit  Note Due to COVID-19 pandemic this visit was conducted virtually. This visit type was conducted due to national recommendations for restrictions regarding the COVID-19 Pandemic (e.g. social distancing, sheltering in place) in an effort to limit this patient's exposure and mitigate transmission in our community. All issues noted in this document were discussed and addressed.  A physical exam was not performed with this format.  I connected with Danny Snyder on 09/12/22 at 11;00am by telephone and verified that I am speaking with the correct person using two identifiers. Danny Snyder is currently located a home during visit. The provider, Ivy Lynn, NP is located in their office at time of visit.  I discussed the limitations, risks, security and privacy concerns of performing an evaluation and management service by telephone and the availability of in person appointments. I also discussed with the patient that there may be a patient responsible charge related to this service. The patient expressed understanding and agreed to proceed.   History and Present Illness:  URI  This is a new problem. The current episode started yesterday. The problem has been unchanged. There has been no fever. Associated symptoms include congestion, coughing and headaches. Pertinent negatives include no abdominal pain, chest pain, nausea, sore throat or wheezing. He has tried nothing for the symptoms.  Cough This is a new problem. The current episode started yesterday. The problem has been unchanged. The cough is Non-productive. Associated symptoms include headaches and nasal congestion. Pertinent negatives include no chest pain, ear congestion, sore throat, shortness of breath, sweats or wheezing. Nothing aggravates the symptoms. He has tried nothing for the symptoms.      Review of Systems  HENT:  Positive for congestion. Negative for sore throat.   Respiratory:  Positive for cough.  Negative for shortness of breath and wheezing.   Cardiovascular:  Negative for chest pain.  Gastrointestinal:  Negative for abdominal pain and nausea.  Neurological:  Positive for headaches.     Observations/Objective: The patient not in distress  Assessment and Plan: -Positive for at home COVID-19 test, worsening symptoms of cough congestion body ache.  Advised patient to Take meds as prescribed -Start patient on molnupirivir, education provided to patient -Best boy for cough - Use a cool mist humidifier  -Use saline nose sprays frequently -Force fluids -For fever or aches or pains- take Tylenol or ibuprofen.    Follow Up Instructions: Follow-up with unresolved symptoms    I discussed the assessment and treatment plan with the patient. The patient was provided an opportunity to ask questions and all were answered. The patient agreed with the plan and demonstrated an understanding of the instructions.   The patient was advised to call back or seek an in-person evaluation if the symptoms worsen or if the condition fails to improve as anticipated.  The above assessment and management plan was discussed with the patient. The patient verbalized understanding of and has agreed to the management plan. Patient is aware to call the clinic if symptoms persist or worsen. Patient is aware when to return to the clinic for a follow-up visit. Patient educated on when it is appropriate to go to the emergency department.   Time call ended: 11:15 AM  I provided 15 minutes of  non face-to-face time during this encounter.    Ivy Lynn, NP

## 2022-10-31 ENCOUNTER — Encounter: Payer: Self-pay | Admitting: Family Medicine

## 2022-10-31 ENCOUNTER — Ambulatory Visit: Payer: 59 | Admitting: Family Medicine

## 2022-10-31 VITALS — BP 121/73 | HR 75 | Temp 98.0°F | Ht 77.0 in | Wt 220.0 lb

## 2022-10-31 DIAGNOSIS — Z23 Encounter for immunization: Secondary | ICD-10-CM | POA: Diagnosis not present

## 2022-10-31 DIAGNOSIS — I10 Essential (primary) hypertension: Secondary | ICD-10-CM | POA: Diagnosis not present

## 2022-10-31 DIAGNOSIS — E559 Vitamin D deficiency, unspecified: Secondary | ICD-10-CM | POA: Diagnosis not present

## 2022-10-31 DIAGNOSIS — R69 Illness, unspecified: Secondary | ICD-10-CM | POA: Diagnosis not present

## 2022-10-31 DIAGNOSIS — I952 Hypotension due to drugs: Secondary | ICD-10-CM | POA: Diagnosis not present

## 2022-10-31 DIAGNOSIS — F5101 Primary insomnia: Secondary | ICD-10-CM

## 2022-10-31 DIAGNOSIS — Z125 Encounter for screening for malignant neoplasm of prostate: Secondary | ICD-10-CM | POA: Diagnosis not present

## 2022-10-31 DIAGNOSIS — E782 Mixed hyperlipidemia: Secondary | ICD-10-CM

## 2022-10-31 MED ORDER — BELSOMRA 10 MG PO TABS: 10.0000 mg | ORAL_TABLET | Freq: Every evening | ORAL | 2 refills | Status: DC | PRN

## 2022-10-31 NOTE — Progress Notes (Signed)
Subjective:  Patient ID: Danny Snyder, male    DOB: Feb 28, 1966  Age: 56 y.o. MRN: [id number]  CC: Medical Management of Chronic Issues   HPI Danny Snyder presents for  follow-up of hypertension. Patient has no history of headache chest pain or shortness of breath or recent cough. Patient also denies symptoms of TIA such as focal numbness or weakness. Patient denies side effects from medication. States taking it regularly.  Using diclofenac for arthritis. Has raied nodule at the left ulnar wrist. Seems worse when doing strenuous work with that arm. Not painful, but bigger.   Gets to sleep okay. After an hour or two tosses and turns nad mulls over his day. Hard to function the next day. Has tried everything - mask, Avoiding TV, drinking wine, etc. Trazodone in use with above result.   Lesion, left wrist. Hard. Constant.   History Danny Snyder has a past medical history of Allergy, Arthritis, GERD (gastroesophageal reflux disease), and Raynaud's disease.   He has a past surgical history that includes Inguinal hernia repair and Colonoscopy.   His family history includes Arthritis in his brother and mother; Cancer in his father; Diabetes in his father; Stomach cancer in his father.He reports that he has quit smoking. He has quit using smokeless tobacco.  His smokeless tobacco use included chew. He reports current alcohol use. He reports that he does not use drugs.  Current Outpatient Medications on File Prior to Visit  Medication Sig Dispense Refill   acetaminophen (TYLENOL) 500 MG tablet Take 1 tablet (500 mg total) by mouth every 6 (six) hours as needed. 30 tablet 0   alfuzosin (UROXATRAL) 10 MG 24 hr tablet Take 1 tablet (10 mg total) by mouth daily with breakfast. 90 tablet 3   amLODipine (NORVASC) 2.5 MG tablet Take 1 tablet (2.5 mg total) by mouth daily. 90 tablet 2   aspirin 81 MG tablet Take 81 mg by mouth daily.     diclofenac (VOLTAREN) 75 MG EC tablet TAKE 1 TABLET TWICE DAILY AS  NEEDED FOR MUSCLE AND JOINT PAIN 180 tablet 0   hyoscyamine (LEVSIN SL) 0.125 MG SL tablet Place 1 tablet (0.125 mg total) under the tongue every 6 (six) hours as needed. 40 tablet 2   mometasone (NASONEX) 50 MCG/ACT nasal spray Place 2 sprays into the nose daily. 17 g 12   pantoprazole (PROTONIX) 40 MG tablet TAKE 1 TABLET TWICE DAILY FOR STOMACH 60 tablet 2   polyethylene glycol (MIRALAX / GLYCOLAX) 17 g packet Take 17 g by mouth daily.     tolterodine (DETROL LA) 4 MG 24 hr capsule Take 1 capsule (4 mg total) by mouth daily. 90 capsule 3   No current facility-administered medications on file prior to visit.    ROS Review of Systems  Constitutional:  Negative for fever.  Respiratory:  Negative for shortness of breath.   Cardiovascular:  Negative for chest pain.  Musculoskeletal:  Positive for arthralgias (routine, manageable.).  Skin:  Negative for rash.    Objective:  BP 121/73   Pulse 75   Temp 98 F (36.7 C)   Ht _0  (1.956 m)   Wt 220 lb (99.8 kg)   SpO2 97%   BMI 26.09 kg/m   BP Readings from Last 3 Encounters:  10/31/22 121/73  04/27/22 119/68  10/27/21 126/73    Wt Readings from Last 3 Encounters:  10/31/22 220 lb (99.8 kg)  04/27/22 210 lb (95.3 kg)  10/27/21 219 lb 3.2 oz (  99.4 kg)     Physical Exam Vitals reviewed.  Constitutional:      Appearance: He is well-developed.  HENT:     Head: Normocephalic and atraumatic.     Right Ear: External ear normal.     Left Ear: External ear normal.     Mouth/Throat:     Pharynx: No oropharyngeal exudate or posterior oropharyngeal erythema.  Eyes:     Pupils: Pupils are equal, round, and reactive to light.  Cardiovascular:     Rate and Rhythm: Normal rate and regular rhythm.     Heart sounds: No murmur heard. Pulmonary:     Effort: No respiratory distress.     Breath sounds: Normal breath sounds.  Musculoskeletal:        General: Deformity (2 cm sq nodule, near bone consistency. Located at left ulnar  wrist. Nontender) present.     Cervical back: Normal range of motion and neck supple.  Neurological:     Mental Status: He is alert and oriented to person, place, and time.       Assessment & Plan:   Danny Snyder was seen today for medical management of chronic issues.  Diagnoses and all orders for this visit:  Primary hypertension -     CBC with Differential/Platelet -     CMP14+EGFR  Mixed hyperlipidemia -     Lipid panel  Vitamin D deficiency -     VITAMIN D 25 Hydroxy (Vit-D Deficiency, Fractures)  Prostate cancer screening -     PSA, total and free  Primary insomnia  Other orders -     Suvorexant (BELSOMRA) 10 MG TABS; Take 10 mg by mouth at bedtime as needed.   Allergies as of 10/31/2022       Reactions   Lodine [etodolac] Other (See Comments)   Mouth sores         Medication List        Accurate as of October 31, 2022  9:12 AM. If you have any questions, ask your nurse or doctor.          STOP taking these medications    amoxicillin-clavulanate 875-125 MG tablet Commonly known as: AUGMENTIN Stopped by: Claretta Fraise, MD   benzonatate 100 MG capsule Commonly known as: Best boy Stopped by: Claretta Fraise, MD   predniSONE 10 MG tablet Commonly known as: DELTASONE Stopped by: Claretta Fraise, MD   Pseudoephedrine-Guaifenesin [phone] MG Tb12 Stopped by: Claretta Fraise, MD   traZODone 150 MG tablet Commonly known as: DESYREL Stopped by: Claretta Fraise, MD       TAKE these medications    acetaminophen 500 MG tablet Commonly known as: TYLENOL Take 1 tablet (500 mg total) by mouth every 6 (six) hours as needed.   alfuzosin 10 MG 24 hr tablet Commonly known as: UROXATRAL Take 1 tablet (10 mg total) by mouth daily with breakfast.   amLODipine 2.5 MG tablet Commonly known as: NORVASC Take 1 tablet (2.5 mg total) by mouth daily.   aspirin 81 MG tablet Take 81 mg by mouth daily.   Belsomra 10 MG Tabs Generic drug: Suvorexant Take  10 mg by mouth at bedtime as needed. Started by: Claretta Fraise, MD   diclofenac 75 MG EC tablet Commonly known as: VOLTAREN TAKE 1 TABLET TWICE DAILY AS NEEDED FOR MUSCLE AND JOINT PAIN   hyoscyamine 0.125 MG SL tablet Commonly known as: LEVSIN SL Place 1 tablet (0.125 mg total) under the tongue every 6 (six) hours as needed.   mometasone  50 MCG/ACT nasal spray Commonly known as: Nasonex Place 2 sprays into the nose daily.   pantoprazole 40 MG tablet Commonly known as: PROTONIX TAKE 1 TABLET TWICE DAILY FOR STOMACH   polyethylene glycol 17 g packet Commonly known as: MIRALAX / GLYCOLAX Take 17 g by mouth daily.   tolterodine 4 MG 24 hr capsule Commonly known as: Detrol LA Take 1 capsule (4 mg total) by mouth daily.        Meds ordered this encounter  Medications   Suvorexant (BELSOMRA) 10 MG TABS    Sig: Take 10 mg by mouth at bedtime as needed.    Dispense:  30 tablet    Refill:  2      Follow-up: Return in about 1 month (around 11/30/2022).  Claretta Fraise, M.D.

## 2022-11-01 LAB — CBC WITH DIFFERENTIAL/PLATELET
Basophils Absolute: 0 10*3/uL (ref 0.0–0.2)
Basos: 1 %
EOS (ABSOLUTE): 0.1 10*3/uL (ref 0.0–0.4)
Eos: 3 %
Hematocrit: 42.2 % (ref 37.5–51.0)
Hemoglobin: 13.8 g/dL (ref 13.0–17.7)
Immature Grans (Abs): 0 10*3/uL (ref 0.0–0.1)
Immature Granulocytes: 1 %
Lymphocytes Absolute: 1 10*3/uL (ref 0.7–3.1)
Lymphs: 26 %
MCH: 28.9 pg (ref 26.6–33.0)
MCHC: 32.7 g/dL (ref 31.5–35.7)
MCV: 89 fL (ref 79–97)
Monocytes Absolute: 0.3 10*3/uL (ref 0.1–0.9)
Monocytes: 9 %
Neutrophils Absolute: 2.4 10*3/uL (ref 1.4–7.0)
Neutrophils: 60 %
Platelets: 187 10*3/uL (ref 150–450)
RBC: 4.77 x10E6/uL (ref 4.14–5.80)
RDW: 13 % (ref 11.6–15.4)
WBC: 3.8 10*3/uL (ref 3.4–10.8)

## 2022-11-01 LAB — CMP14+EGFR
ALT: 31 IU/L (ref 0–44)
AST: 33 IU/L (ref 0–40)
Albumin/Globulin Ratio: 2.3 — ABNORMAL HIGH (ref 1.2–2.2)
Albumin: 4.6 g/dL (ref 3.8–4.9)
Alkaline Phosphatase: 70 IU/L (ref 44–121)
BUN/Creatinine Ratio: 16 (ref 9–20)
BUN: 14 mg/dL (ref 6–24)
Bilirubin Total: <0.2 mg/dL (ref 0.0–1.2)
CO2: 20 mmol/L (ref 20–29)
Calcium: 9.4 mg/dL (ref 8.7–10.2)
Chloride: 105 mmol/L (ref 96–106)
Creatinine, Ser: 0.89 mg/dL (ref 0.76–1.27)
Globulin, Total: 2 g/dL (ref 1.5–4.5)
Glucose: 116 mg/dL — ABNORMAL HIGH (ref 70–99)
Potassium: 4.4 mmol/L (ref 3.5–5.2)
Sodium: 143 mmol/L (ref 134–144)
Total Protein: 6.6 g/dL (ref 6.0–8.5)
eGFR: 101 mL/min/{1.73_m2} (ref >59)

## 2022-11-01 LAB — PSA, TOTAL AND FREE
PSA, Free Pct: 58.3 %
PSA, Free: 0.35 ng/mL
Prostate Specific Ag, Serum: 0.6 ng/mL (ref 0.0–4.0)

## 2022-11-01 LAB — LIPID PANEL
Chol/HDL Ratio: 4.1 ratio (ref 0.0–5.0)
Cholesterol, Total: 187 mg/dL (ref 100–199)
HDL: 46 mg/dL (ref >39)
LDL Chol Calc (NIH): 113 mg/dL — ABNORMAL HIGH (ref 0–99)
Triglycerides: 157 mg/dL — ABNORMAL HIGH (ref 0–149)
VLDL Cholesterol Cal: 28 mg/dL (ref 5–40)

## 2022-11-01 LAB — VITAMIN D 25 HYDROXY (VIT D DEFICIENCY, FRACTURES): Vit D, 25-Hydroxy: 44.7 ng/mL (ref 30.0–100.0)

## 2022-11-02 NOTE — Progress Notes (Signed)
Hello Levan,  Your lab result is normal and/or stable.Some minor variations that are not significant are commonly marked abnormal, but do not represent any medical problem for you.  Best regards, Arnola Crittendon, M.D.

## 2022-11-04 ENCOUNTER — Telehealth: Payer: Self-pay | Admitting: Family Medicine

## 2022-11-06 ENCOUNTER — Other Ambulatory Visit: Payer: Self-pay | Admitting: Family Medicine

## 2022-11-06 MED ORDER — ESZOPICLONE 2 MG PO TABS: 2.0000 mg | ORAL_TABLET | Freq: Every evening | ORAL | 2 refills | Status: DC | PRN

## 2022-11-06 NOTE — Telephone Encounter (Signed)
Please let the patient know that I sent their prescription to their pharmacy. Thanks, WS 

## 2022-11-07 NOTE — Telephone Encounter (Signed)
Patient spoke with pharmacy and they said they sent over a PA for this medication through the website. He is calling to make sure that we received and asked for it to be started ASAP

## 2022-11-07 NOTE — Telephone Encounter (Signed)
Danny Snyder ([name]) PA Case ID #: [id number] Rx #: [id number] Need Help? Call us at [phone] Outcome Approved today Your PA request has been approved. Additional information will be provided in the approval communication. (Message [phone]) Authorization Expiration Date: 11/06/2025 Drug Eszopiclone '2MG'$  tablets ePA cloud Child psychotherapist Electronic PA Form [phone] N[employer]) Original Claim Info [address] LIMIT EXCD PA REQ'D CALL [id number] MAXIMUM DAILY DOSE OF .5000(PHARMACY HELP DESK [phone]) For RxLocal Coupon Price of: $21.33 submit to BIN: [id number] PCN: CP Group: [id number] --Service provided at no cost and no switch fee to the pharmacy--

## 2022-11-23 ENCOUNTER — Other Ambulatory Visit: Payer: Self-pay | Admitting: Family Medicine

## 2022-12-05 ENCOUNTER — Encounter: Payer: Self-pay | Admitting: Family Medicine

## 2022-12-27 ENCOUNTER — Ambulatory Visit: Payer: 59 | Admitting: Family Medicine

## 2022-12-29 ENCOUNTER — Other Ambulatory Visit: Payer: Self-pay | Admitting: Family Medicine

## 2023-02-06 ENCOUNTER — Other Ambulatory Visit: Payer: Self-pay | Admitting: Family Medicine

## 2023-02-06 NOTE — Telephone Encounter (Signed)
Controlled medication needs to be filled in an office visit

## 2023-02-06 NOTE — Telephone Encounter (Signed)
Pt took last pill last night. He says that he really need this rx tonight. Pt last apt 11/01/2023. Please call back

## 2023-02-07 ENCOUNTER — Telehealth: Payer: Self-pay | Admitting: Family Medicine

## 2023-02-07 ENCOUNTER — Encounter: Payer: Self-pay | Admitting: Family Medicine

## 2023-02-07 ENCOUNTER — Other Ambulatory Visit: Payer: Self-pay | Admitting: Family Medicine

## 2023-02-07 MED ORDER — ESZOPICLONE 2 MG PO TABS: 2.0000 mg | ORAL_TABLET | Freq: Every evening | ORAL | 2 refills | Status: DC | PRN

## 2023-02-07 NOTE — Telephone Encounter (Signed)
I can see from the documentation where we had a misunderstanding. When He said he was doing well, I told him he didn't have to be seen, but that was almost 2 months ago. Under the circumstances, though, it seems fair, especially since I know him well to go ahead with the prescription. I sent it to his pharmacy for 3 months. I will need to see him in 3 months.Marland Kitchen

## 2023-02-07 NOTE — Telephone Encounter (Unsigned)
Patient is very upset because he cannot get his medication refilled for sleep He does not have a controlled substance agreement on file and doesn't know anything about needing an appointment sooner for his Lunesta.  He wants to speak with Dr. Livia Snellen or his nurse to understand what is going on.  He cannot go without this medication.  Please call.

## 2023-02-07 NOTE — Telephone Encounter (Signed)
Sent MyChart message advising medication was refilled and appt needed in 3 months

## 2023-02-08 ENCOUNTER — Ambulatory Visit: Payer: 59 | Admitting: Family Medicine

## 2023-02-24 ENCOUNTER — Other Ambulatory Visit: Payer: Self-pay | Admitting: Family Medicine

## 2023-02-24 DIAGNOSIS — I7300 Raynaud's syndrome without gangrene: Secondary | ICD-10-CM

## 2023-03-13 ENCOUNTER — Ambulatory Visit: Payer: 59 | Admitting: Family Medicine

## 2023-04-03 ENCOUNTER — Other Ambulatory Visit: Payer: Self-pay | Admitting: Family Medicine

## 2023-04-03 DIAGNOSIS — I7300 Raynaud's syndrome without gangrene: Secondary | ICD-10-CM

## 2023-04-17 ENCOUNTER — Other Ambulatory Visit: Payer: Self-pay | Admitting: Family Medicine

## 2023-04-25 ENCOUNTER — Encounter: Payer: Self-pay | Admitting: Family Medicine

## 2023-04-25 ENCOUNTER — Ambulatory Visit: Payer: 59 | Admitting: Family Medicine

## 2023-04-25 VITALS — BP 116/68 | HR 76 | Temp 97.7°F | Ht 77.0 in | Wt 214.6 lb

## 2023-04-25 DIAGNOSIS — E782 Mixed hyperlipidemia: Secondary | ICD-10-CM

## 2023-04-25 DIAGNOSIS — I10 Essential (primary) hypertension: Secondary | ICD-10-CM

## 2023-04-25 DIAGNOSIS — I7300 Raynaud's syndrome without gangrene: Secondary | ICD-10-CM

## 2023-04-25 DIAGNOSIS — M159 Polyosteoarthritis, unspecified: Secondary | ICD-10-CM

## 2023-04-25 MED ORDER — ALFUZOSIN HCL ER 10 MG PO TB24: ORAL_TABLET | ORAL | 2 refills | Status: DC

## 2023-04-25 MED ORDER — ESZOPICLONE 2 MG PO TABS: 2.0000 mg | ORAL_TABLET | Freq: Every evening | ORAL | 2 refills | Status: DC | PRN

## 2023-04-25 MED ORDER — AMLODIPINE BESYLATE 2.5 MG PO TABS: 2.5000 mg | ORAL_TABLET | Freq: Every day | ORAL | 2 refills | Status: DC

## 2023-04-25 MED ORDER — TOLTERODINE TARTRATE ER 4 MG PO CP24: 4.0000 mg | ORAL_CAPSULE | Freq: Every day | ORAL | 2 refills | Status: DC

## 2023-04-25 MED ORDER — DICLOFENAC SODIUM 75 MG PO TBEC: DELAYED_RELEASE_TABLET | ORAL | 3 refills | Status: DC

## 2023-04-25 NOTE — Patient Instructions (Signed)
Get a left thumb Spica, wear at night and as much as possible through the day

## 2023-04-25 NOTE — Progress Notes (Signed)
Subjective:  Patient ID: Danny Snyder, male    DOB: [birth year]  Age: 57 y.o. MRN: [id number]  CC: Medical Management of Chronic Issues   HPI Danny Snyder presents for  follow-up of hypertension. Patient has no history of headache chest pain or shortness of breath or recent cough. Patient also denies symptoms of TIA such as focal numbness or weakness. Patient denies side effects from medication. States taking it regularly.  Insomnia responding to lunesta and 1/2 trazodone taken together.   Up twice a night to urinate. Alfuzosin not causing side effects. Not sure how much it helps.  History Danny Snyder has a past medical history of Allergy, Arthritis, GERD (gastroesophageal reflux disease), and Raynaud's disease.   Danny Snyder has a past surgical history that includes Inguinal hernia repair and Colonoscopy.   His family history includes Arthritis in his brother and mother; Cancer in his father; Diabetes in his father; Stomach cancer in his father.Danny Snyder reports that Danny Snyder has quit smoking. Danny Snyder has quit using smokeless tobacco.  His smokeless tobacco use included chew. Danny Snyder reports current alcohol use. Danny Snyder reports that Danny Snyder does not use drugs.  Current Outpatient Medications on File Prior to Visit  Medication Sig Dispense Refill   acetaminophen (TYLENOL) 500 MG tablet Take 1 tablet (500 mg total) by mouth every 6 (six) hours as needed. 30 tablet 0   aspirin 81 MG tablet Take 81 mg by mouth daily.     hyoscyamine (LEVSIN SL) 0.125 MG SL tablet Place 1 tablet (0.125 mg total) under the tongue every 6 (six) hours as needed. 40 tablet 2   mometasone (NASONEX) 50 MCG/ACT nasal spray Place 2 sprays into the nose daily. 17 g 12   pantoprazole (PROTONIX) 40 MG tablet TAKE ONE TABLET BY MOUTH TWICE DAILY FOR STOMACH 180 tablet 3   polyethylene glycol (MIRALAX / GLYCOLAX) 17 g packet Take 17 g by mouth daily.     No current facility-administered medications on file prior to visit.    ROS Review of Systems   Constitutional:  Negative for fever.  Respiratory:  Negative for shortness of breath.   Cardiovascular:  Negative for chest pain.  Musculoskeletal:  Positive for arthralgias (right hand causing a lot of pain. Considering rheumatology follow up.).  Skin:  Negative for rash.    Objective:  BP 116/68   Pulse 76   Temp 97.7 F (36.5 C)   Ht 6\' 5"  (1.956 m)   Wt 214 lb 9.6 oz (97.3 kg)   SpO2 97%   BMI 25.45 kg/m   BP Readings from Last 3 Encounters:  04/25/23 116/68  10/31/22 121/73  04/27/22 119/68    Wt Readings from Last 3 Encounters:  04/25/23 214 lb 9.6 oz (97.3 kg)  10/31/22 220 lb (99.8 kg)  04/27/22 210 lb (95.3 kg)     Physical Exam Vitals reviewed.  Constitutional:      Appearance: Danny Snyder is well-developed.  HENT:     Head: Normocephalic and atraumatic.     Right Ear: External ear normal.     Left Ear: External ear normal.     Mouth/Throat:     Pharynx: No oropharyngeal exudate or posterior oropharyngeal erythema.  Eyes:     Pupils: Pupils are equal, round, and reactive to light.  Cardiovascular:     Rate and Rhythm: Normal rate and regular rhythm.     Heart sounds: No murmur heard. Pulmonary:     Effort: No respiratory distress.     Breath sounds: Normal  breath sounds.  Musculoskeletal:     Cervical back: Normal range of motion and neck supple.  Neurological:     Mental Status: Danny Snyder is alert and oriented to person, place, and time.       Assessment & Plan:   Danny Snyder was seen today for medical management of chronic issues.  Diagnoses and all orders for this visit:  Primary hypertension -     CBC with Differential/Platelet -     CMP14+EGFR  Mixed hyperlipidemia -     Lipid panel  Raynaud's disease without gangrene -     amLODipine (NORVASC) 2.5 MG tablet; Take 1 tablet (2.5 mg total) by mouth daily.  Primary osteoarthritis involving multiple joints  Other orders -     alfuzosin (UROXATRAL) 10 MG 24 hr tablet; TAKE ONE TABLET ONCE DAILY WITH  BREAKFAST -     diclofenac (VOLTAREN) 75 MG EC tablet; TAKE 1 TABLET TWICE DAILY AS NEEDED FOR MUSCLE AND JOINT PAIN -     eszopiclone (LUNESTA) 2 MG TABS tablet; Take 1 tablet (2 mg total) by mouth at bedtime as needed for sleep. -     tolterodine (DETROL LA) 4 MG 24 hr capsule; Take 1 capsule (4 mg total) by mouth daily.   Allergies as of 04/25/2023       Reactions   Lodine [etodolac] Other (See Comments)   Mouth sores         Medication List        Accurate as of Apr 25, 2023  9:49 AM. If you have any questions, ask your nurse or doctor.          acetaminophen 500 MG tablet Commonly known as: TYLENOL Take 1 tablet (500 mg total) by mouth every 6 (six) hours as needed.   alfuzosin 10 MG 24 hr tablet Commonly known as: UROXATRAL TAKE ONE TABLET ONCE DAILY WITH BREAKFAST   amLODipine 2.5 MG tablet Commonly known as: NORVASC Take 1 tablet (2.5 mg total) by mouth daily.   aspirin 81 MG tablet Take 81 mg by mouth daily.   diclofenac 75 MG EC tablet Commonly known as: VOLTAREN TAKE 1 TABLET TWICE DAILY AS NEEDED FOR MUSCLE AND JOINT PAIN   eszopiclone 2 MG Tabs tablet Commonly known as: Lunesta Take 1 tablet (2 mg total) by mouth at bedtime as needed for sleep.   hyoscyamine 0.125 MG SL tablet Commonly known as: LEVSIN SL Place 1 tablet (0.125 mg total) under the tongue every 6 (six) hours as needed.   mometasone 50 MCG/ACT nasal spray Commonly known as: Nasonex Place 2 sprays into the nose daily.   pantoprazole 40 MG tablet Commonly known as: PROTONIX TAKE ONE TABLET BY MOUTH TWICE DAILY FOR STOMACH   polyethylene glycol 17 g packet Commonly known as: MIRALAX / GLYCOLAX Take 17 g by mouth daily.   tolterodine 4 MG 24 hr capsule Commonly known as: DETROL LA Take 1 capsule (4 mg total) by mouth daily.        Meds ordered this encounter  Medications   alfuzosin (UROXATRAL) 10 MG 24 hr tablet    Sig: TAKE ONE TABLET ONCE DAILY WITH BREAKFAST     Dispense:  90 tablet    Refill:  2   amLODipine (NORVASC) 2.5 MG tablet    Sig: Take 1 tablet (2.5 mg total) by mouth daily.    Dispense:  90 tablet    Refill:  2   diclofenac (VOLTAREN) 75 MG EC tablet    Sig:  TAKE 1 TABLET TWICE DAILY AS NEEDED FOR MUSCLE AND JOINT PAIN    Dispense:  180 tablet    Refill:  3   eszopiclone (LUNESTA) 2 MG TABS tablet    Sig: Take 1 tablet (2 mg total) by mouth at bedtime as needed for sleep.    Dispense:  30 tablet    Refill:  2   tolterodine (DETROL LA) 4 MG 24 hr capsule    Sig: Take 1 capsule (4 mg total) by mouth daily.    Dispense:  90 capsule    Refill:  2      Follow-up: Return in about 6 months (around 10/26/2023).  Mechele Claude, M.D.

## 2023-04-26 LAB — CBC WITH DIFFERENTIAL/PLATELET
Basophils Absolute: 0 10*3/uL (ref 0.0–0.2)
Basos: 0 %
EOS (ABSOLUTE): 0.1 10*3/uL (ref 0.0–0.4)
Eos: 2 %
Hematocrit: 43.2 % (ref 37.5–51.0)
Hemoglobin: 14.1 g/dL (ref 13.0–17.7)
Immature Grans (Abs): 0 10*3/uL (ref 0.0–0.1)
Immature Granulocytes: 0 %
Lymphocytes Absolute: 1.1 10*3/uL (ref 0.7–3.1)
Lymphs: 23 %
MCH: 28.5 pg (ref 26.6–33.0)
MCHC: 32.6 g/dL (ref 31.5–35.7)
MCV: 87 fL (ref 79–97)
Monocytes Absolute: 0.4 10*3/uL (ref 0.1–0.9)
Monocytes: 8 %
Neutrophils Absolute: 3.2 10*3/uL (ref 1.4–7.0)
Neutrophils: 67 %
Platelets: 183 10*3/uL (ref 150–450)
RBC: 4.95 x10E6/uL (ref 4.14–5.80)
RDW: 13.5 % (ref 11.6–15.4)
WBC: 4.8 10*3/uL (ref 3.4–10.8)

## 2023-04-26 LAB — CMP14+EGFR
ALT: 24 IU/L (ref 0–44)
AST: 19 IU/L (ref 0–40)
Albumin/Globulin Ratio: 2.1 (ref 1.2–2.2)
Albumin: 4.5 g/dL (ref 3.8–4.9)
Alkaline Phosphatase: 63 IU/L (ref 44–121)
BUN/Creatinine Ratio: 17 (ref 9–20)
BUN: 16 mg/dL (ref 6–24)
Bilirubin Total: 0.4 mg/dL (ref 0.0–1.2)
CO2: 20 mmol/L (ref 20–29)
Calcium: 9.7 mg/dL (ref 8.7–10.2)
Chloride: 103 mmol/L (ref 96–106)
Creatinine, Ser: 0.95 mg/dL (ref 0.76–1.27)
Globulin, Total: 2.1 g/dL (ref 1.5–4.5)
Glucose: 104 mg/dL — ABNORMAL HIGH (ref 70–99)
Potassium: 4.5 mmol/L (ref 3.5–5.2)
Sodium: 141 mmol/L (ref 134–144)
Total Protein: 6.6 g/dL (ref 6.0–8.5)
eGFR: 94 mL/min/{1.73_m2} (ref >59)

## 2023-04-26 LAB — LIPID PANEL
Chol/HDL Ratio: 4.9 ratio (ref 0.0–5.0)
Cholesterol, Total: 211 mg/dL — ABNORMAL HIGH (ref 100–199)
HDL: 43 mg/dL (ref >39)
LDL Chol Calc (NIH): 132 mg/dL — ABNORMAL HIGH (ref 0–99)
Triglycerides: 199 mg/dL — ABNORMAL HIGH (ref 0–149)
VLDL Cholesterol Cal: 36 mg/dL (ref 5–40)

## 2023-05-01 ENCOUNTER — Other Ambulatory Visit: Payer: Self-pay | Admitting: Family Medicine

## 2023-05-01 MED ORDER — ROSUVASTATIN CALCIUM 10 MG PO TABS
10.0000 mg | ORAL_TABLET | Freq: Every day | ORAL | 1 refills | Status: DC
Start: 2023-05-01 — End: 2023-10-26

## 2023-05-18 ENCOUNTER — Encounter: Payer: Self-pay | Admitting: Family Medicine

## 2023-08-07 ENCOUNTER — Other Ambulatory Visit: Payer: Self-pay | Admitting: Family Medicine

## 2023-08-23 ENCOUNTER — Other Ambulatory Visit: Payer: Self-pay | Admitting: Family Medicine

## 2023-08-24 ENCOUNTER — Telehealth: Payer: Self-pay | Admitting: Family Medicine

## 2023-08-24 ENCOUNTER — Other Ambulatory Visit: Payer: Self-pay | Admitting: Family Medicine

## 2023-08-24 MED ORDER — TRAZODONE HCL 150 MG PO TABS: ORAL_TABLET | ORAL | 3 refills | Status: DC

## 2023-08-24 NOTE — Telephone Encounter (Signed)
Patient aware.

## 2023-08-24 NOTE — Telephone Encounter (Signed)
Pt states he doesn't know why his Trazadone Rx request is being denied because Dr Darlyn Read told him at his last visit to keep taking the trazadone with his other sleep medicine. Please advise.

## 2023-08-24 NOTE — Telephone Encounter (Signed)
Please let the patient know that I sent their prescription to their pharmacy. Thanks, WS 

## 2023-08-30 ENCOUNTER — Telehealth: Payer: BC Managed Care – PPO | Admitting: Physician Assistant

## 2023-08-30 DIAGNOSIS — B9689 Other specified bacterial agents as the cause of diseases classified elsewhere: Secondary | ICD-10-CM

## 2023-08-30 DIAGNOSIS — J019 Acute sinusitis, unspecified: Secondary | ICD-10-CM

## 2023-08-31 ENCOUNTER — Encounter: Payer: Self-pay | Admitting: Family Medicine

## 2023-08-31 MED ORDER — BENZONATATE 100 MG PO CAPS
100.0000 mg | ORAL_CAPSULE | Freq: Three times a day (TID) | ORAL | 0 refills | Status: DC | PRN
Start: 2023-08-31 — End: 2023-10-26

## 2023-08-31 MED ORDER — AMOXICILLIN-POT CLAVULANATE 875-125 MG PO TABS
1.0000 | ORAL_TABLET | Freq: Two times a day (BID) | ORAL | 0 refills | Status: DC
Start: 2023-08-31 — End: 2023-10-26

## 2023-08-31 NOTE — Progress Notes (Signed)

## 2023-09-18 ENCOUNTER — Other Ambulatory Visit: Payer: Self-pay | Admitting: Family Medicine

## 2023-09-18 DIAGNOSIS — I7300 Raynaud's syndrome without gangrene: Secondary | ICD-10-CM

## 2023-10-23 ENCOUNTER — Other Ambulatory Visit: Payer: Self-pay | Admitting: Family Medicine

## 2023-10-26 ENCOUNTER — Ambulatory Visit: Payer: 59 | Admitting: Family Medicine

## 2023-10-26 ENCOUNTER — Encounter: Payer: Self-pay | Admitting: Family Medicine

## 2023-10-26 VITALS — BP 126/68 | HR 65 | Temp 97.8°F | Ht 77.0 in | Wt 205.0 lb

## 2023-10-26 DIAGNOSIS — I7300 Raynaud's syndrome without gangrene: Secondary | ICD-10-CM | POA: Diagnosis not present

## 2023-10-26 DIAGNOSIS — E782 Mixed hyperlipidemia: Secondary | ICD-10-CM | POA: Diagnosis not present

## 2023-10-26 DIAGNOSIS — Z125 Encounter for screening for malignant neoplasm of prostate: Secondary | ICD-10-CM | POA: Diagnosis not present

## 2023-10-26 DIAGNOSIS — E559 Vitamin D deficiency, unspecified: Secondary | ICD-10-CM

## 2023-10-26 DIAGNOSIS — J309 Allergic rhinitis, unspecified: Secondary | ICD-10-CM

## 2023-10-26 DIAGNOSIS — I10 Essential (primary) hypertension: Secondary | ICD-10-CM

## 2023-10-26 MED ORDER — BETAMETHASONE SOD PHOS & ACET 6 (3-3) MG/ML IJ SUSP
6.0000 mg | Freq: Once | INTRAMUSCULAR | Status: AC
  Administered 2023-10-26: 6 mg via INTRAMUSCULAR

## 2023-10-26 NOTE — Progress Notes (Signed)
Subjective:  Patient ID: Danny Snyder, male    DOB: [birth year]  Age: 57 y.o. MRN: [id number]  CC: Medical Management of Chronic Issues   HPI [name] presents for Having a lo of thick mucous in posterior throat. Ongoing for several months. Using Netti pot, Alegry 24 and flonase  Sleep is good. Best ever. Using lunesta and a piece of a trazodone.  Bladder well controlled in the day. At night up at 3-4 AM. Then up a second time.    presents for  follow-up of hypertension. Patient has no history of headache chest pain or shortness of breath or recent cough. Patient also denies symptoms of TIA such as focal numbness or weakness. Patient denies side effects from medication. States taking it regularly.   in for follow-up of elevated cholesterol. Doing well without complaints on current medication. Denies side effects of statin including myalgia and arthralgia and nausea. Currently no chest pain, shortness of breath or other cardiovascular related symptoms noted.      10/26/2023    7:51 AM 04/25/2023    9:17 AM 10/31/2022    8:20 AM  Depression screen PHQ 2/9  Decreased Interest 0 0 0  Down, Depressed, Hopeless 0 0 0  PHQ - 2 Score 0 0 0    History Renardo has a past medical history of Allergy, Arthritis, GERD (gastroesophageal reflux disease), and Raynaud's disease.   He has a past surgical history that includes Inguinal hernia repair and Colonoscopy.   His family history includes Arthritis in his brother and mother; Cancer in his father; Diabetes in his father; Stomach cancer in his father.He reports that he has quit smoking. He has quit using smokeless tobacco.  His smokeless tobacco use included chew. He reports current alcohol use. He reports that he does not use drugs.    ROS Review of Systems  Constitutional:  Negative for fever.  Respiratory:  Negative for shortness of breath.   Cardiovascular:  Negative for chest pain.  Gastrointestinal:  Negative for abdominal pain.   Musculoskeletal:  Positive for arthralgias.  Psychiatric/Behavioral:  Positive for sleep disturbance.     Objective:  BP 126/68   Pulse 65   Temp 97.8 F (36.6 C)   Ht 6\' 5"  (1.956 m)   Wt 205 lb (93 kg)   SpO2 97%   BMI 24.31 kg/m   BP Readings from Last 3 Encounters:  10/26/23 126/68  04/25/23 116/68  10/31/22 121/73    Wt Readings from Last 3 Encounters:  10/26/23 205 lb (93 kg)  04/25/23 214 lb 9.6 oz (97.3 kg)  10/31/22 220 lb (99.8 kg)     Physical Exam Vitals reviewed.  Constitutional:      Appearance: He is well-developed.  HENT:     Head: Normocephalic and atraumatic.     Right Ear: External ear normal.     Left Ear: External ear normal.     Mouth/Throat:     Pharynx: No oropharyngeal exudate or posterior oropharyngeal erythema.  Eyes:     Pupils: Pupils are equal, round, and reactive to light.  Cardiovascular:     Rate and Rhythm: Normal rate and regular rhythm.     Heart sounds: No murmur heard. Pulmonary:     Effort: No respiratory distress.     Breath sounds: Normal breath sounds.  Musculoskeletal:     Cervical back: Normal range of motion and neck supple.  Neurological:     Mental Status: He is alert and oriented to  person, place, and time.       Assessment & Plan:   [name] was seen today for medical management of chronic issues.  Diagnoses and all orders for this visit:  Primary hypertension -     CBC with Differential/Platelet -     CMP14+EGFR  Mixed hyperlipidemia -     Lipid panel  Vitamin D deficiency -     VITAMIN D 25 Hydroxy (Vit-D Deficiency, Fractures)  Prostate cancer screening -     PSA, total and free  Raynaud's disease without gangrene -     amLODipine (NORVASC) 2.5 MG tablet; Take 1 tablet (2.5 mg total) by mouth daily.  Allergic rhinitis, unspecified seasonality, unspecified trigger -     betamethasone acetate-betamethasone sodium phosphate (CELESTONE) injection 6 mg  Other orders -      alfuzosin (UROXATRAL) 10 MG 24 hr tablet; TAKE ONE TABLET DAILY WITH BREAKFAST -     diclofenac (VOLTAREN) 75 MG EC tablet; TAKE ONE TABLET TWICE DAILY AS NEEDED FOR MUSCLE AND JOINT PAIN -     eszopiclone (LUNESTA) 2 MG TABS tablet; Take 1 tablet (2 mg total) by mouth at bedtime as needed for sleep. Take immediately before bedtime -     pantoprazole (PROTONIX) 40 MG tablet; TAKE ONE TABLET BY MOUTH TWICE DAILY FOR STOMACH -     tolterodine (DETROL LA) 4 MG 24 hr capsule; Take 1 capsule (4 mg total) by mouth daily.       I have discontinued Sharlene Motts "Gery Pray [place]"'s rosuvastatin, amoxicillin-clavulanate, and benzonatate. I have also changed his amLODipine, eszopiclone, and tolterodine. Additionally, I am having him maintain his aspirin, mometasone, polyethylene glycol, hyoscyamine, acetaminophen, traZODone, alfuzosin, diclofenac, and pantoprazole. We administered betamethasone acetate-betamethasone sodium phosphate.  Allergies as of 10/26/2023       Reactions   Lodine [etodolac] Other (See Comments)   Mouth sores         Medication List        Accurate as of October 26, 2023 11:59 PM. If you have any questions, ask your nurse or doctor.          STOP taking these medications    amoxicillin-clavulanate 875-125 MG tablet Commonly known as: AUGMENTIN Stopped by: Keigen Caddell   benzonatate 100 MG capsule Commonly known as: TESSALON Stopped by: Cielo Arias   rosuvastatin 10 MG tablet Commonly known as: Control and instrumentation engineer by: Quinterious Walraven       TAKE these medications    acetaminophen 500 MG tablet Commonly known as: TYLENOL Take 1 tablet (500 mg total) by mouth every 6 (six) hours as needed.   alfuzosin 10 MG 24 hr tablet Commonly known as: UROXATRAL TAKE ONE TABLET DAILY WITH BREAKFAST   amLODipine 2.5 MG tablet Commonly known as: NORVASC Take 1 tablet (2.5 mg total) by mouth daily.   aspirin 81 MG tablet Take 81 mg by mouth daily.   diclofenac 75  MG EC tablet Commonly known as: VOLTAREN TAKE ONE TABLET TWICE DAILY AS NEEDED FOR MUSCLE AND JOINT PAIN   eszopiclone 2 MG Tabs tablet Commonly known as: LUNESTA Take 1 tablet (2 mg total) by mouth at bedtime as needed for sleep. Take immediately before bedtime What changed: See the new instructions. Changed by: Zhaniya Swallows   hyoscyamine 0.125 MG SL tablet Commonly known as: LEVSIN SL Place 1 tablet (0.125 mg total) under the tongue every 6 (six) hours as needed.   mometasone 50 MCG/ACT nasal spray Commonly known as: Nasonex Place  2 sprays into the nose daily.   pantoprazole 40 MG tablet Commonly known as: PROTONIX TAKE ONE TABLET BY MOUTH TWICE DAILY FOR STOMACH   polyethylene glycol 17 g packet Commonly known as: MIRALAX / GLYCOLAX Take 17 g by mouth daily.   tolterodine 4 MG 24 hr capsule Commonly known as: DETROL LA Take 1 capsule (4 mg total) by mouth daily.   traZODone 150 MG tablet Commonly known as: DESYREL TAKE 1/3 TO 1 TABLET NIGHTLY AS NEEDED FOR SLEEP-Must keep appt on 05/10         Follow-up: Return in about 6 months (around 04/24/2024).  Mechele Claude, M.D.

## 2023-10-27 LAB — CBC WITH DIFFERENTIAL/PLATELET
Basophils Absolute: 0 10*3/uL (ref 0.0–0.2)
Basos: 0 %
EOS (ABSOLUTE): 0.1 10*3/uL (ref 0.0–0.4)
Eos: 2 %
Hematocrit: 43.9 % (ref 37.5–51.0)
Hemoglobin: 14.6 g/dL (ref 13.0–17.7)
Immature Grans (Abs): 0 10*3/uL (ref 0.0–0.1)
Immature Granulocytes: 0 %
Lymphocytes Absolute: 1.1 10*3/uL (ref 0.7–3.1)
Lymphs: 27 %
MCH: 29.4 pg (ref 26.6–33.0)
MCHC: 33.3 g/dL (ref 31.5–35.7)
MCV: 89 fL (ref 79–97)
Monocytes Absolute: 0.4 10*3/uL (ref 0.1–0.9)
Monocytes: 9 %
Neutrophils Absolute: 2.6 10*3/uL (ref 1.4–7.0)
Neutrophils: 62 %
Platelets: 183 10*3/uL (ref 150–450)
RBC: 4.96 x10E6/uL (ref 4.14–5.80)
RDW: 13 % (ref 11.6–15.4)
WBC: 4.2 10*3/uL (ref 3.4–10.8)

## 2023-10-27 LAB — CMP14+EGFR
ALT: 29 IU/L (ref 0–44)
AST: 25 IU/L (ref 0–40)
Albumin: 4.2 g/dL (ref 3.8–4.9)
Alkaline Phosphatase: 63 IU/L (ref 44–121)
BUN/Creatinine Ratio: 15 (ref 9–20)
BUN: 15 mg/dL (ref 6–24)
Bilirubin Total: 0.3 mg/dL (ref 0.0–1.2)
CO2: 23 mmol/L (ref 20–29)
Calcium: 9.5 mg/dL (ref 8.7–10.2)
Chloride: 104 mmol/L (ref 96–106)
Creatinine, Ser: 1 mg/dL (ref 0.76–1.27)
Globulin, Total: 2.4 g/dL (ref 1.5–4.5)
Glucose: 99 mg/dL (ref 70–99)
Potassium: 4.5 mmol/L (ref 3.5–5.2)
Sodium: 141 mmol/L (ref 134–144)
Total Protein: 6.6 g/dL (ref 6.0–8.5)
eGFR: 88 mL/min/1.73

## 2023-10-27 LAB — PSA, TOTAL AND FREE
PSA, Free Pct: 50 %
PSA, Free: 0.4 ng/mL
Prostate Specific Ag, Serum: 0.8 ng/mL (ref 0.0–4.0)

## 2023-10-27 LAB — LIPID PANEL
Chol/HDL Ratio: 4.3 ratio (ref 0.0–5.0)
Cholesterol, Total: 205 mg/dL — ABNORMAL HIGH (ref 100–199)
HDL: 48 mg/dL
LDL Chol Calc (NIH): 132 mg/dL — ABNORMAL HIGH (ref 0–99)
Triglycerides: 139 mg/dL (ref 0–149)
VLDL Cholesterol Cal: 25 mg/dL (ref 5–40)

## 2023-10-27 LAB — VITAMIN D 25 HYDROXY (VIT D DEFICIENCY, FRACTURES): Vit D, 25-Hydroxy: 46.7 ng/mL (ref 30.0–100.0)

## 2023-10-29 ENCOUNTER — Encounter: Payer: Self-pay | Admitting: Family Medicine

## 2023-10-29 MED ORDER — PANTOPRAZOLE SODIUM 40 MG PO TBEC: DELAYED_RELEASE_TABLET | ORAL | 3 refills | Status: DC

## 2023-10-29 MED ORDER — AMLODIPINE BESYLATE 2.5 MG PO TABS
2.5000 mg | ORAL_TABLET | Freq: Every day | ORAL | 3 refills | Status: DC
Start: 2023-10-29 — End: 2024-02-29

## 2023-10-29 MED ORDER — DICLOFENAC SODIUM 75 MG PO TBEC: DELAYED_RELEASE_TABLET | ORAL | 3 refills | Status: DC

## 2023-10-29 MED ORDER — ALFUZOSIN HCL ER 10 MG PO TB24: ORAL_TABLET | ORAL | 3 refills | Status: DC

## 2023-10-29 MED ORDER — ESZOPICLONE 2 MG PO TABS: 2.0000 mg | ORAL_TABLET | Freq: Every evening | ORAL | 2 refills | Status: DC | PRN

## 2023-10-29 MED ORDER — TOLTERODINE TARTRATE ER 4 MG PO CP24: 4.0000 mg | ORAL_CAPSULE | Freq: Every day | ORAL | 3 refills | Status: DC

## 2023-12-03 ENCOUNTER — Telehealth: Payer: 59 | Admitting: Family

## 2023-12-03 DIAGNOSIS — J019 Acute sinusitis, unspecified: Secondary | ICD-10-CM

## 2023-12-03 MED ORDER — AMOXICILLIN-POT CLAVULANATE 875-125 MG PO TABS: 1.0000 | ORAL_TABLET | Freq: Two times a day (BID) | ORAL | 0 refills | Status: DC

## 2023-12-03 NOTE — Progress Notes (Signed)
E-Visit for Sinus Problems  We are sorry that you are not feeling well.  Here is how we plan to help!  Based on what you have shared with me it looks like you have sinusitis.  Sinusitis is inflammation and infection in the sinus cavities of the head.  Based on your presentation I believe you most likely have Acute Bacterial Sinusitis.  This is an infection caused by bacteria and is treated with antibiotics. I have prescribed Augmentin 875mg/125mg one tablet twice daily with food, for 7 days. You may use an oral decongestant such as Mucinex D or if you have glaucoma or high blood pressure use plain Mucinex. Saline nasal spray help and can safely be used as often as needed for congestion.  If you develop worsening sinus pain, fever or notice severe headache and vision changes, or if symptoms are not better after completion of antibiotic, please schedule an appointment with a health care provider.    Sinus infections are not as easily transmitted as other respiratory infection, however we still recommend that you avoid close contact with loved ones, especially the very young and elderly.  Remember to wash your hands thoroughly throughout the day as this is the number one way to prevent the spread of infection!  Home Care: Only take medications as instructed by your medical team. Complete the entire course of an antibiotic. Do not take these medications with alcohol. A steam or ultrasonic humidifier can help congestion.  You can place a towel over your head and breathe in the steam from hot water coming from a faucet. Avoid close contacts especially the very young and the elderly. Cover your mouth when you cough or sneeze. Always remember to wash your hands.  Get Help Right Away If: You develop worsening fever or sinus pain. You develop a severe head ache or visual changes. Your symptoms persist after you have completed your treatment plan.  Make sure you Understand these instructions. Will watch  your condition. Will get help right away if you are not doing well or get worse.  Thank you for choosing an e-visit.  Your e-visit answers were reviewed by a board certified advanced clinical practitioner to complete your personal care plan. Depending upon the condition, your plan could have included both over the counter or prescription medications.  Please review your pharmacy choice. Make sure the pharmacy is open so you can pick up prescription now. If there is a problem, you may contact your provider through MyChart messaging and have the prescription routed to another pharmacy.  Your safety is important to us. If you have drug allergies check your prescription carefully.   For the next 24 hours you can use MyChart to ask questions about today's visit, request a non-urgent call back, or ask for a work or school excuse. You will get an email in the next two days asking about your experience. I hope that your e-visit has been valuable and will speed your recovery.   Approximately 5 minutes was spent documenting and reviewing patient's chart.   

## 2023-12-15 ENCOUNTER — Encounter: Payer: Self-pay | Admitting: Family Medicine

## 2023-12-25 ENCOUNTER — Telehealth: Payer: Self-pay

## 2023-12-25 ENCOUNTER — Other Ambulatory Visit (HOSPITAL_COMMUNITY): Payer: Self-pay

## 2023-12-25 NOTE — Telephone Encounter (Signed)
 Pharmacy Patient Advocate Encounter   Received notification from CoverMyMeds that prior authorization for Tolterodine  Tartrate ER 4MG  er capsules is required/requested.   Insurance verification completed.   The patient is insured through [facility] .   Per test claim: PA required; PA submitted to above mentioned insurance via CoverMyMeds Key/confirmation #/EOC (Key: [id number])     Status is pending

## 2023-12-27 ENCOUNTER — Other Ambulatory Visit (HOSPITAL_COMMUNITY): Payer: Self-pay

## 2023-12-27 NOTE — Telephone Encounter (Signed)
 Pharmacy Patient Advocate Encounter  Received notification from [facility] that Prior Authorization for {Tolterodine  Tartrate ER 4MG  er capsules has been APPROVED from 1.07.25 to 1.0726. Ran test claim, Copay is $43.20. This test claim was processed through [facility]- copay amounts may vary at other pharmacies due to pharmacy/plan contracts, or as the patient moves through the different stages of their insurance plan.   PA #/Case ID/Reference #: (Key: [id number])

## 2024-01-31 ENCOUNTER — Other Ambulatory Visit: Payer: Self-pay | Admitting: Family Medicine

## 2024-02-29 ENCOUNTER — Ambulatory Visit: Admitting: Family Medicine

## 2024-02-29 ENCOUNTER — Encounter: Payer: Self-pay | Admitting: Family Medicine

## 2024-02-29 VITALS — BP 122/65 | HR 83 | Temp 97.5°F | Ht 77.0 in | Wt 206.0 lb

## 2024-02-29 DIAGNOSIS — M150 Primary generalized (osteo)arthritis: Secondary | ICD-10-CM

## 2024-02-29 DIAGNOSIS — I7300 Raynaud's syndrome without gangrene: Secondary | ICD-10-CM

## 2024-02-29 DIAGNOSIS — M19071 Primary osteoarthritis, right ankle and foot: Secondary | ICD-10-CM

## 2024-02-29 DIAGNOSIS — M67449 Ganglion, unspecified hand: Secondary | ICD-10-CM

## 2024-02-29 MED ORDER — NIFEDIPINE ER OSMOTIC RELEASE 60 MG PO TB24: 60.0000 mg | ORAL_TABLET | Freq: Every day | ORAL | 1 refills | Status: DC

## 2024-02-29 NOTE — Progress Notes (Addendum)
 Subjective:  Patient ID: Danny Snyder, male    DOB: 1966-07-23  Age: 58 y.o. MRN: [id number]  CC: Raynaud's (Having problems in his feet. Feet sweating badly and very cold or hot. ) and Pain (Right foot padding of foot. Effecting walking. Can't stand flat. /Knot in between pointer finger and middle finger that is painful. )   HPI Danny Snyder presents for follow up of Raynaud's. Feet go from cold to hot and sweaty. Foot pain at ball is moderate and intermittent. Knot is not painful. Can feel it with motion. Located at base of finger distal to MCP.      10/26/2023    7:51 AM 04/25/2023    9:17 AM 10/31/2022    8:20 AM  Depression screen PHQ 2/9  Decreased Interest 0 0 0  Down, Depressed, Hopeless 0 0 0  PHQ - 2 Score 0 0 0    History Danny Snyder has a past medical history of Allergy, Arthritis, GERD (gastroesophageal reflux disease), and Raynaud's disease.   Danny Snyder has a past surgical history that includes Inguinal hernia repair and Colonoscopy.   His family history includes Arthritis in his brother and mother; Cancer in his father; Diabetes in his father; Stomach cancer in his father.Danny Snyder reports that Danny Snyder has quit smoking. Danny Snyder has quit using smokeless tobacco.  His smokeless tobacco use included chew. Danny Snyder reports current alcohol use. Danny Snyder reports that Danny Snyder does not use drugs.    ROS Review of Systems  Constitutional:  Negative for fever.  Respiratory:  Negative for shortness of breath.   Cardiovascular:  Negative for chest pain.  Musculoskeletal:  Positive for arthralgias.  Skin:  Negative for rash.    Objective:  BP 122/65   Pulse 83   Temp (!) 97.5 F (36.4 C)   Ht 6\' 5"  (1.956 m)   Wt 206 lb (93.4 kg)   SpO2 97%   BMI 24.43 kg/m   BP Readings from Last 3 Encounters:  02/29/24 122/65  10/26/23 126/68  04/25/23 116/68    Wt Readings from Last 3 Encounters:  02/29/24 206 lb (93.4 kg)  10/26/23 205 lb (93 kg)  04/25/23 214 lb 9.6 oz (97.3 kg)     Physical Exam Vitals  reviewed.  Constitutional:      Appearance: Danny Snyder is well-developed.  HENT:     Head: Normocephalic and atraumatic.     Right Ear: External ear normal.     Left Ear: External ear normal.     Mouth/Throat:     Pharynx: No oropharyngeal exudate or posterior oropharyngeal erythema.  Eyes:     Pupils: Pupils are equal, round, and reactive to light.  Cardiovascular:     Rate and Rhythm: Normal rate and regular rhythm.     Heart sounds: No murmur heard. Pulmonary:     Effort: No respiratory distress.     Breath sounds: Normal breath sounds.  Musculoskeletal:        General: Tenderness (bottom of right foot at ball between 2& 3) present.     Cervical back: Normal range of motion and neck supple.  Neurological:     Mental Status: Danny Snyder is alert and oriented to person, place, and time.    Intermettarsal joint 2-3 right footinjected with 1/4 ml of celestone and 3/4 ml of marcan after prep with betadine. Sterile technique used.   Assessment & Plan:   Danny "Diquan Kassis" was seen today for raynaud's and pain.  Diagnoses and all orders for this visit:  Raynaud's disease without gangrene  Primary osteoarthritis involving multiple joints  Ganglion cyst of finger  Other orders -     NIFEdipine (PROCARDIA XL) 60 MG 24 hr tablet; Take 1 tablet (60 mg total) by mouth daily.       I have discontinued Sharlene Motts "Gery Pray Tortora"'s amLODipine. I am also having him start on NIFEdipine. Additionally, I am having him maintain his aspirin, mometasone, polyethylene glycol, hyoscyamine, acetaminophen, traZODone, alfuzosin, diclofenac, pantoprazole, tolterodine, amoxicillin-clavulanate, and eszopiclone.  Allergies as of 02/29/2024       Reactions   Lodine [etodolac] Other (See Comments)   Mouth sores         Medication List        Accurate as of February 29, 2024 11:59 PM. If you have any questions, ask your nurse or doctor.          STOP taking these medications    amLODipine  2.5 MG tablet Commonly known as: NORVASC Stopped by: Myeshia Fojtik       TAKE these medications    acetaminophen 500 MG tablet Commonly known as: TYLENOL Take 1 tablet (500 mg total) by mouth Snyder 6 (six) hours as needed.   alfuzosin 10 MG 24 hr tablet Commonly known as: UROXATRAL TAKE ONE TABLET DAILY WITH BREAKFAST   amoxicillin-clavulanate 875-125 MG tablet Commonly known as: AUGMENTIN Take 1 tablet by mouth 2 (two) times daily.   aspirin 81 MG tablet Take 81 mg by mouth daily.   diclofenac 75 MG EC tablet Commonly known as: VOLTAREN TAKE ONE TABLET TWICE DAILY AS NEEDED FOR MUSCLE AND JOINT PAIN   eszopiclone 2 MG Tabs tablet Commonly known as: LUNESTA TAKE ONE TABLET AT BEDTIME AS NEEDED FOR SLEEP   hyoscyamine 0.125 MG SL tablet Commonly known as: LEVSIN SL Place 1 tablet (0.125 mg total) under the tongue Snyder 6 (six) hours as needed.   mometasone 50 MCG/ACT nasal spray Commonly known as: Nasonex Place 2 sprays into the nose daily.   NIFEdipine 60 MG 24 hr tablet Commonly known as: Procardia XL Take 1 tablet (60 mg total) by mouth daily. Started by: Gennette Shadix   pantoprazole 40 MG tablet Commonly known as: PROTONIX TAKE ONE TABLET BY MOUTH TWICE DAILY FOR STOMACH   polyethylene glycol 17 g packet Commonly known as: MIRALAX / GLYCOLAX Take 17 g by mouth daily.   tolterodine 4 MG 24 hr capsule Commonly known as: DETROL LA Take 1 capsule (4 mg total) by mouth daily.   traZODone 150 MG tablet Commonly known as: DESYREL TAKE 1/3 TO 1 TABLET NIGHTLY AS NEEDED FOR SLEEP-Must keep appt on 05/10         Follow-up: Return if symptoms worsen or fail to improve.  Mechele Claude, M.D.

## 2024-03-02 ENCOUNTER — Encounter: Payer: Self-pay | Admitting: Family Medicine

## 2024-04-24 ENCOUNTER — Encounter: Payer: Self-pay | Admitting: Family Medicine

## 2024-04-24 ENCOUNTER — Ambulatory Visit: Payer: 59 | Admitting: Family Medicine

## 2024-04-24 VITALS — BP 129/86 | HR 74 | Temp 97.5°F | Ht 77.0 in | Wt 205.0 lb

## 2024-04-24 DIAGNOSIS — M150 Primary generalized (osteo)arthritis: Secondary | ICD-10-CM

## 2024-04-24 DIAGNOSIS — I10 Essential (primary) hypertension: Secondary | ICD-10-CM | POA: Diagnosis not present

## 2024-04-24 DIAGNOSIS — F5101 Primary insomnia: Secondary | ICD-10-CM

## 2024-04-24 DIAGNOSIS — I7300 Raynaud's syndrome without gangrene: Secondary | ICD-10-CM

## 2024-04-24 MED ORDER — ESZOPICLONE 2 MG PO TABS: 2.0000 mg | ORAL_TABLET | Freq: Every evening | ORAL | 1 refills | Status: DC | PRN

## 2024-04-24 NOTE — Progress Notes (Signed)
 Danny Snyder presents for raynauds. Change of meds was adequate for BP. Diastolic usually under 80 at home.. No raynauds symptoms, however weather not as cold.   Arthritis does still flare. Recent fall. "Stoved" him up Patient in for follow-up of GERD. Currently asymptomatic taking  PPI daily. There is no chest pain or heartburn. No hematemesis and no melena. No dysphagia or choking. Onset is remote. Progression is stable. Complicating factors, none.  Sleep requires combo of lunesta  with trazodone .  Sleeps well as long as Danny Snyder takes both.  Denies side effects including next-day drowsiness.     04/24/2024    8:07 AM 10/26/2023    7:51 AM 04/25/2023    9:17 AM  Depression screen PHQ 2/9  Decreased Interest 0 0 0  Down, Depressed, Hopeless 0 0 0  PHQ - 2 Score 0 0 0  Altered sleeping 0    Tired, decreased energy 0    Change in appetite 0    Feeling bad or failure about yourself  0    Trouble concentrating 0    Moving slowly or fidgety/restless 0    Suicidal thoughts 0    PHQ-9 Score 0    Difficult doing work/chores Not difficult at all      History Danny Snyder has a past medical history of Allergy, Arthritis, GERD (gastroesophageal reflux disease), and Raynaud's disease.   Danny Snyder has a past surgical history that includes Inguinal hernia repair and Colonoscopy.   His family history includes Arthritis in his brother and mother; Cancer in his father; Diabetes in his father; Stomach cancer in his father.Danny Snyder reports that Danny Snyder has quit smoking. Danny Snyder has quit using smokeless tobacco.  His smokeless tobacco use included chew. Danny Snyder reports current alcohol use. Danny Snyder reports that Danny Snyder does not use drugs.    ROS Review of Systems  Constitutional:  Negative for fever.  Respiratory:  Negative for shortness of breath.   Cardiovascular:  Negative for chest pain.  Musculoskeletal:  Positive for arthralgias and myalgias.  Skin:  Negative for rash.  Neurological: Negative.     Objective:  BP 129/86   Pulse 74    Temp (!) 97.5 F (36.4 C)   Ht 6\' 5"  (1.956 m)   Wt 205 lb (93 kg)   SpO2 98%   BMI 24.31 kg/m   BP Readings from Last 3 Encounters:  04/24/24 129/86  02/29/24 122/65  10/26/23 126/68    Wt Readings from Last 3 Encounters:  04/24/24 205 lb (93 kg)  02/29/24 206 lb (93.4 kg)  10/26/23 205 lb (93 kg)     Physical Exam Vitals reviewed.  Constitutional:      Appearance: Danny Snyder is well-developed.  HENT:     Head: Normocephalic and atraumatic.     Right Ear: External ear normal.     Left Ear: External ear normal.     Mouth/Throat:     Pharynx: No oropharyngeal exudate or posterior oropharyngeal erythema.  Eyes:     Pupils: Pupils are equal, round, and reactive to light.  Cardiovascular:     Rate and Rhythm: Normal rate and regular rhythm.     Heart sounds: No murmur heard. Pulmonary:     Effort: No respiratory distress.     Breath sounds: Normal breath sounds.  Musculoskeletal:     Cervical back: Normal range of motion and neck supple.  Neurological:     Mental Status: Danny Snyder is alert and oriented to person, place, and time.      Assessment &  Plan:  There are no diagnoses linked to this encounter.   Follow-up: No follow-ups on file.  Roise Cleaver, M.D.

## 2024-06-17 ENCOUNTER — Other Ambulatory Visit: Payer: Self-pay | Admitting: Family Medicine

## 2024-07-08 ENCOUNTER — Ambulatory Visit: Payer: Self-pay

## 2024-07-08 NOTE — Telephone Encounter (Signed)
 FYI Only or Action Required?: FYI only for provider.  Patient was last seen in primary care on 04/24/2024 by Danny Lowers, MD.  Called Nurse Triage reporting Dizziness.  Symptoms began 2 days ago.  Interventions attempted: Rest, hydration, or home remedies.  Symptoms are: gradually improving.  Triage Disposition: See Physician Within 24 Hours  Patient/caregiver understands and will follow disposition?: Yes     Copied from CRM [id number]. Topic: Clinical - Red Word Triage >> Jul 08, 2024  1:50 PM [name] wrote: Red Word that prompted transfer to Nurse Triage: feeling a little dizzy, lightheaded, had a syncope episode on Saturday, was sweating and passed out    Reason for Disposition  [1] MODERATE dizziness (e.g., interferes with normal activities) AND [2] has NOT been evaluated by doctor (or NP/PA) for this  (Exception: Dizziness caused by heat exposure, sudden standing, or poor fluid intake.)  Answer Assessment - Initial Assessment Questions 1. DESCRIPTION: Describe your dizziness.     I feel swimmy headed   2. LIGHTHEADED: Do you feel lightheaded? (e.g., somewhat faint, woozy, weak upon standing)     Yes 3. VERTIGO: Do you feel like either you or the room is spinning or tilting? (i.e., vertigo)     No 4. SEVERITY: How bad is it?  Do you feel like you are going to faint? Can you stand and walk?     No 5. ONSET:  When did the dizziness begin?     2 days ago 8. CAUSE: What do you think is causing the dizziness? (e.g., decreased fluids or food, diarrhea, emotional distress, heat exposure, new medicine, sudden standing, vomiting; unknown)     Unsure, was at concert so possibly due to heat  9. RECURRENT SYMPTOM: Have you had dizziness before? If Yes, ask: When was the last time? What happened that time?     No  10. OTHER SYMPTOMS: Do you have any other symptoms? (e.g., fever, chest pain, vomiting, diarrhea, bleeding)       Had a syncopal episode 2 days  ago while at a concert  Protocols used: Dizziness - Lightheadedness-A-AH

## 2024-07-08 NOTE — Telephone Encounter (Signed)
 Noted

## 2024-07-09 ENCOUNTER — Ambulatory Visit: Payer: Self-pay | Admitting: Nurse Practitioner

## 2024-07-09 ENCOUNTER — Encounter: Payer: Self-pay | Admitting: Nurse Practitioner

## 2024-07-09 ENCOUNTER — Ambulatory Visit: Admitting: Nurse Practitioner

## 2024-07-09 VITALS — BP 108/65 | HR 84 | Temp 98.1°F | Ht 77.0 in | Wt 204.0 lb

## 2024-07-09 DIAGNOSIS — R55 Syncope and collapse: Secondary | ICD-10-CM | POA: Diagnosis not present

## 2024-07-09 LAB — GLUCOSE HEMOCUE WAIVED: Glu Hemocue Waived: 134 mg/dL — ABNORMAL HIGH (ref 70–99)

## 2024-07-09 LAB — BAYER DCA HB A1C WAIVED: HB A1C (BAYER DCA - WAIVED): 4.9 % (ref 4.8–5.6)

## 2024-07-09 NOTE — Progress Notes (Signed)
 Acute Office Visit  Subjective:     Patient ID: Danny Snyder, male    DOB: [birth year], 58 y.o.   MRN: [id number]  Chief Complaint  Patient presents with   Dizziness    Having dizziness since Saturday, passed out Saturday and has been feeling dizzy since     HPI Danny Snyder is a 58 year old male presenting on July 09, 2024, for an acute visit due to dizziness and a syncopal episode. The patient reports that while attending a concert with his wife on Saturday, he had a sip of wine and stood up when prompted by the performers. Shortly after standing, he began to feel extremely tired and experienced profuse sweating. He recalls attempting to sit down but lost consciousness and fell. Upon regaining consciousness, he was surrounded by bystanders who were trying to revive him. EMS was called, but the patient declined transport to the hospital, stating that he felt significantly better after consuming soda and candy provided by others.  The patient notes similar past episodes where he has felt lightheaded or unwell and improved after ingesting something sweet. He expresses concern that his symptoms may be related to blood sugar issues, particularly given his sister's recent diagnosis of diabetes. Since the episode, he reports persistent fatigue but denies headache, chest pain, fever, or chills.    Active Ambulatory Problems    Diagnosis Date Noted   Thumb pain 02/12/2015   Raynaud's syndrome 02/12/2015   Primary osteoarthritis involving multiple joints 08/29/2018   Bladder spasms 08/29/2018   Gastroesophageal reflux disease with esophagitis 04/17/2019   Primary insomnia 10/31/2022   Primary hypertension 10/31/2022   Mixed hyperlipidemia 10/31/2022   Vitamin D  deficiency 10/31/2022   Syncope and collapse 07/09/2024   Resolved Ambulatory Problems    Diagnosis Date Noted   Sinobronchitis 02/12/2015   Past Medical History:  Diagnosis Date   Allergy    Arthritis    GERD  (gastroesophageal reflux disease)    Raynaud's disease     Review of Systems  Constitutional:  Positive for malaise/fatigue.  HENT:  Negative for congestion and sore throat.   Respiratory:  Negative for cough, shortness of breath and wheezing.   Cardiovascular:  Negative for chest pain and leg swelling.  Gastrointestinal:  Negative for constipation, melena and nausea.  Musculoskeletal:  Negative for falls.  Skin:  Negative for itching and rash.  Neurological:  Positive for dizziness. Negative for headaches.   Negative unless indicated in HPI    Objective:    BP 108/65   Pulse 84   Temp 98.1 F (36.7 C) (Temporal)   Ht 6' 5 (1.956 m)   Wt 204 lb (92.5 kg)   SpO2 96%   BMI 24.19 kg/m  BP Readings from Last 3 Encounters:  07/09/24 108/65  04/24/24 129/86  02/29/24 122/65   Wt Readings from Last 3 Encounters:  07/09/24 204 lb (92.5 kg)  04/24/24 205 lb (93 kg)  02/29/24 206 lb (93.4 kg)      Physical Exam Vitals and nursing note reviewed.  Constitutional:      General: He is not in acute distress. HENT:     Head: Normocephalic and atraumatic.     Nose: Nose normal.     Mouth/Throat:     Mouth: Mucous membranes are moist.  Eyes:     Extraocular Movements: Extraocular movements intact.     Conjunctiva/sclera: Conjunctivae normal.     Pupils: Pupils are equal, round, and reactive to light.  Cardiovascular:  Heart sounds: Normal heart sounds.  Pulmonary:     Effort: Pulmonary effort is normal.     Breath sounds: Normal breath sounds.  Musculoskeletal:        General: Normal range of motion.     Right lower leg: No edema.     Left lower leg: No edema.  Skin:    General: Skin is warm and dry.     Capillary Refill: Capillary refill takes less than 2 seconds.     Findings: No rash.  Neurological:     Mental Status: He is alert and oriented to person, place, and time. Mental status is at baseline.  Psychiatric:        Mood and Affect: Mood normal.         Behavior: Behavior normal.        Thought Content: Thought content normal.        Judgment: Judgment normal.    BG 134 A1c 4.9  No results found for any visits on 07/09/24.      Assessment & Plan:  Syncope and collapse -     Bayer DCA Hb A1c Waived -     Glucose Hemocue Waived -     BMP8+EGFR  Danny Snyder is a 58 yrs old Caucasian male seen today for syncope, no acute distress. BMP results pending; A1c and BG normal; increase hydration (may get OTC IV fluid power to boost electrolytes)  Encourage healthy lifestyle choices, including diet (rich in fruits, vegetables, and lean proteins, and low in salt and simple carbohydrates) and exercise (at least 30 minutes of moderate physical activity daily).     The above assessment and management plan was discussed with the patient. The patient verbalized understanding of and has agreed to the management plan. Patient is aware to call the clinic if they develop any new symptoms or if symptoms persist or worsen. Patient is aware when to return to the clinic for a follow-up visit. Patient educated on when it is appropriate to go to the emergency department.  Return if symptoms worsen or fail to improve.  Emidio Warrell [place] Thompson, DNP [facility] [address] [place], [place] [address] [phone]  Note: This document was prepared by [employer] voice dictation technology and any errors that results from this process are unintentional.

## 2024-07-10 LAB — BMP8+EGFR
BUN/Creatinine Ratio: 24 — ABNORMAL HIGH (ref 9–20)
BUN: 20 mg/dL (ref 6–24)
CO2: 20 mmol/L (ref 20–29)
Calcium: 9.5 mg/dL (ref 8.7–10.2)
Chloride: 106 mmol/L (ref 96–106)
Creatinine, Ser: 0.85 mg/dL (ref 0.76–1.27)
Glucose: 102 mg/dL — ABNORMAL HIGH (ref 70–99)
Potassium: 4.2 mmol/L (ref 3.5–5.2)
Sodium: 142 mmol/L (ref 134–144)
eGFR: 101 mL/min/1.73 (ref >59)

## 2024-09-29 ENCOUNTER — Ambulatory Visit
Admission: EM | Admit: 2024-09-29 | Discharge: 2024-09-29 | Disposition: A | Discharge status: Home / Self Care | Attending: Family Medicine | Admitting: Family Medicine

## 2024-09-29 ENCOUNTER — Encounter: Payer: Self-pay | Admitting: Emergency Medicine

## 2024-09-29 DIAGNOSIS — M51379 Other intervertebral disc degeneration, lumbosacral region without mention of lumbar back pain or lower extremity pain: Secondary | ICD-10-CM | POA: Diagnosis not present

## 2024-09-29 DIAGNOSIS — S39012A Strain of muscle, fascia and tendon of lower back, initial encounter: Secondary | ICD-10-CM

## 2024-09-29 MED ORDER — HYDROCODONE-ACETAMINOPHEN 7.5-325 MG PO TABS: 1.0000 | ORAL_TABLET | Freq: Four times a day (QID) | ORAL | 0 refills | Status: DC | PRN

## 2024-09-29 MED ORDER — METHYLPREDNISOLONE ACETATE 80 MG/ML IJ SUSP
80.0000 mg | Freq: Once | INTRAMUSCULAR | Status: AC
  Administered 2024-09-29: 80 mg via INTRAMUSCULAR

## 2024-09-29 MED ORDER — TIZANIDINE HCL 4 MG PO TABS: 4.0000 mg | ORAL_TABLET | Freq: Four times a day (QID) | ORAL | 0 refills | Status: DC | PRN

## 2024-09-29 NOTE — ED Triage Notes (Signed)
 Patient c/o low back pain x 4 days, no injury.  Patient was getting in the bed when the pain occurred.  Patient has taken Flexeril  and Voltaren  for pain.  Applied ice, heat and a back massager w/o any relief.  Denies any urinary sx's.

## 2024-09-29 NOTE — Discharge Instructions (Signed)
 Continue take your Voltaren  2 times a day Take Zanaflex  as needed as muscle relaxer.  Take 1 pill 3-4 times a day.  If needed may increase to 2 pills I have prescribed hydrocodone to take for severe pain.  Do not drive on hydrocodone See your doctor if not improving by next week

## 2024-09-29 NOTE — ED Provider Notes (Signed)
 [facility]    CSN: [id number] Arrival date & time: 09/29/24  1014      History   Chief Complaint Chief Complaint  Patient presents with   Back Pain    HPI Danny Snyder is a 58 y.o. male.   HPI  Patient is here for low back pain.  He has known lumbar degenerative disc disease.  He occasionally has back pain but usually is manageable at home.  Currently hurts in the right low back region.  Points to the right SI region.  Says that the usual medications, Voltaren  twice a day and Flexeril  as needed are not helping.  Has used ice and heat.  Has been trying to limit his activities.  No numbness or weakness into the legs.  No bowel or bladder complaints.  No trauma  Past Medical History:  Diagnosis Date   Allergy    Arthritis    GERD (gastroesophageal reflux disease)    Raynaud's disease     Patient Active Problem List   Diagnosis Date Noted   Syncope and collapse 07/09/2024   Primary insomnia 10/31/2022   Primary hypertension 10/31/2022   Mixed hyperlipidemia 10/31/2022   Vitamin D  deficiency 10/31/2022   Gastroesophageal reflux disease with esophagitis 04/17/2019   Primary osteoarthritis involving multiple joints 08/29/2018   Bladder spasms 08/29/2018   Thumb pain 02/12/2015   Raynaud's syndrome 02/12/2015    Past Surgical History:  Procedure Laterality Date   COLONOSCOPY     INGUINAL HERNIA REPAIR     with mesh       Home Medications    Prior to Admission medications   Medication Sig Start Date End Date Taking? Authorizing Provider  acetaminophen  (TYLENOL ) 500 MG tablet Take 1 tablet (500 mg total) by mouth every 6 (six) hours as needed. 09/12/22  Yes Ijaola, Onyeje M, NP  alfuzosin  (UROXATRAL ) 10 MG 24 hr tablet TAKE ONE TABLET DAILY WITH BREAKFAST 10/29/23  Yes Stacks, Butler, MD  amLODipine  (NORVASC ) 2.5 MG tablet Take 2.5 mg by mouth daily.   Yes [provider]  aspirin 81 MG tablet Take 81 mg by mouth daily.   Yes [provider]  diclofenac  (VOLTAREN ) 75 MG EC tablet TAKE ONE TABLET TWICE DAILY AS NEEDED FOR MUSCLE AND JOINT PAIN 10/29/23  Yes Zollie Butler, MD  eszopiclone  (LUNESTA ) 2 MG TABS tablet Take 1 tablet (2 mg total) by mouth at bedtime as needed for sleep. Take immediately before bedtime 04/24/24  Yes Stacks, Butler, MD  HYDROcodone-acetaminophen  [facility]) 7.5-325 MG tablet Take 1 tablet by mouth every 6 (six) hours as needed for moderate pain (pain score 4-6). 09/29/24  Yes Maranda Jamee Jacob, MD  hyoscyamine  (LEVSIN  SL) 0.125 MG SL tablet Place 1 tablet (0.125 mg total) under the tongue every 6 (six) hours as needed. 04/27/22  Yes StacksButler, MD  mometasone  (NASONEX ) 50 MCG/ACT nasal spray Place 2 sprays into the nose daily. 01/08/20  Yes Stacks, Butler, MD  pantoprazole  (PROTONIX ) 40 MG tablet TAKE ONE TABLET BY MOUTH TWICE DAILY FOR STOMACH 10/29/23  Yes Stacks, Butler, MD  polyethylene glycol (MIRALAX / GLYCOLAX) 17 g packet Take 17 g by mouth daily.   Yes [provider]  tiZANidine  (ZANAFLEX ) 4 MG tablet Take 1-2 tablets (4-8 mg total) by mouth every 6 (six) hours as needed for muscle spasms. 09/29/24  Yes Maranda Jamee Jacob, MD  tolterodine  (DETROL  LA) 4 MG 24 hr capsule Take 1 capsule (4 mg total) by mouth daily. 10/29/23  Yes Zollie Lowers, MD  traZODone  (DESYREL ) 150 MG tablet TAKE 1/3 TO ONE TABLET AT BEDTIME AS NEEDED FOR SLEEP 06/17/24  Yes Stacks, Lowers, MD  NIFEdipine  (PROCARDIA  XL/NIFEDICAL XL) 60 MG 24 hr tablet TAKE ONE TABLET ONCE DAILY 06/17/24   Zollie Lowers, MD    Family History Family History  Problem Relation Age of Onset   Arthritis Mother    Cancer Father    Diabetes Father    Stomach cancer Father    Arthritis Brother    Colon polyps Neg Hx    Esophageal cancer Neg Hx    Rectal cancer Neg Hx     Social History Social History   Tobacco Use   Smoking status: Former   Smokeless tobacco: Former    Types: Associate Professor status: Never  Used  Substance Use Topics   Alcohol use: Yes    Comment: once a week.   Drug use: No     Allergies   Lodine [etodolac]   Review of Systems Review of Systems See HPI  Physical Exam Triage Vital Signs ED Triage Vitals  Encounter Vitals Group     BP 09/29/24 1034 96/65     Girls Systolic BP Percentile --      Girls Diastolic BP Percentile --      Boys Systolic BP Percentile --      Boys Diastolic BP Percentile --      Pulse Rate 09/29/24 1034 78     Resp 09/29/24 1034 18     Temp 09/29/24 1034 98.8 F (37.1 C)     Temp src --      SpO2 09/29/24 1034 96 %     Weight 09/29/24 1032 200 lb (90.7 kg)     Height 09/29/24 1032 6' 5 (1.956 m)     Head Circumference --      Peak Flow --      Pain Score 09/29/24 1032 3     Pain Loc --      Pain Education --      Exclude from Growth Chart --    No data found.  Updated Vital Signs BP 96/65 (BP Location: Right Arm)   Pulse 78   Temp 98.8 F (37.1 C)   Resp 18   Ht 6' 5 (1.956 m)   Wt 90.7 kg   SpO2 96%   BMI 23.72 kg/m   Visual Acuity Right Eye Distance:   Left Eye Distance:   Bilateral Distance:    Right Eye Near:   Left Eye Near:    Bilateral Near:     Physical Exam Constitutional:      General: He is not in acute distress.    Appearance: He is well-developed.  HENT:     Head: Normocephalic and atraumatic.  Eyes:     Conjunctiva/sclera: Conjunctivae normal.     Pupils: Pupils are equal, round, and reactive to light.  Cardiovascular:     Rate and Rhythm: Normal rate.  Pulmonary:     Effort: Pulmonary effort is normal. No respiratory distress.  Abdominal:     General: There is no distension.     Palpations: Abdomen is soft.  Musculoskeletal:        General: Normal range of motion.     Cervical back: Normal range of motion.     Right lower leg: No edema.     Left lower leg: No edema.     Comments: Tenderness to palpation in the  right SI region.  Mildly increased muscle tone in the right lumbar  column of muscles.  Slow but full range of motion.  Strength, sensation, range of motion, and reflexes are normal in both lower extremities.  Straight leg raise is negative  Skin:    General: Skin is warm and dry.  Neurological:     General: No focal deficit present.     Mental Status: He is alert.     Sensory: No sensory deficit.     Coordination: Coordination normal.     Gait: Gait normal.     Deep Tendon Reflexes: Reflexes normal.      UC Treatments / Results  Labs (all labs ordered are listed, but only abnormal results are displayed) Labs Reviewed - No data to display  EKG   Radiology No results found.  Procedures Procedures (including critical care time)  Medications Ordered in UC Medications  methylPREDNISolone acetate (DEPO-MEDROL) injection 80 mg (80 mg Intramuscular Given 09/29/24 1059)    Initial Impression / Assessment and Plan / UC Course  I have reviewed the triage vital signs and the nursing notes.  Pertinent labs & imaging results that were available during my care of the patient were reviewed by me and considered in my medical decision making (see chart for details).     Reviewed that patient has known DJD on prior x-rays.  He did not have trauma.  Occasional flareups are not unusual.  Will treat with a shot of steroid.  Patient declines oral steroids stating that they make him vomit.  He is also given a different muscle relaxer since the Flexeril  does not appear to be working.  Hydrocodone for severe pain.  Follow-up with PCP if not improving by next week Final Clinical Impressions(s) / UC Diagnoses   Final diagnoses:  Strain of lumbar region, initial encounter  Degeneration of intervertebral disc of lumbosacral region without discogenic pain or lower extremity pain     Discharge Instructions      Continue take your Voltaren  2 times a day Take Zanaflex  as needed as muscle relaxer.  Take 1 pill 3-4 times a day.  If needed may increase to 2 pills I  have prescribed hydrocodone to take for severe pain.  Do not drive on hydrocodone See your doctor if not improving by next week   ED Prescriptions     Medication Sig Dispense Auth. Provider   tiZANidine  (ZANAFLEX ) 4 MG tablet Take 1-2 tablets (4-8 mg total) by mouth every 6 (six) hours as needed for muscle spasms. 21 tablet Maranda Jamee Jacob, MD   HYDROcodone-acetaminophen  [facility]) 7.5-325 MG tablet Take 1 tablet by mouth every 6 (six) hours as needed for moderate pain (pain score 4-6). 12 tablet Maranda Jamee Jacob, MD      I have reviewed the PDMP during this encounter.   Maranda Jamee Jacob, MD 09/29/24 1320

## 2024-10-18 ENCOUNTER — Other Ambulatory Visit: Payer: Self-pay | Admitting: Family Medicine

## 2024-10-31 ENCOUNTER — Ambulatory Visit: Payer: Self-pay | Admitting: Family Medicine

## 2024-10-31 ENCOUNTER — Encounter: Payer: Self-pay | Admitting: Family Medicine

## 2024-10-31 VITALS — BP 111/69 | HR 74 | Temp 97.9°F | Ht 77.0 in | Wt 202.0 lb

## 2024-10-31 DIAGNOSIS — E559 Vitamin D deficiency, unspecified: Secondary | ICD-10-CM | POA: Diagnosis not present

## 2024-10-31 DIAGNOSIS — Z0001 Encounter for general adult medical examination with abnormal findings: Secondary | ICD-10-CM

## 2024-10-31 DIAGNOSIS — R350 Frequency of micturition: Secondary | ICD-10-CM

## 2024-10-31 DIAGNOSIS — F5101 Primary insomnia: Secondary | ICD-10-CM

## 2024-10-31 DIAGNOSIS — N401 Enlarged prostate with lower urinary tract symptoms: Secondary | ICD-10-CM

## 2024-10-31 DIAGNOSIS — E782 Mixed hyperlipidemia: Secondary | ICD-10-CM | POA: Diagnosis not present

## 2024-10-31 DIAGNOSIS — Z125 Encounter for screening for malignant neoplasm of prostate: Secondary | ICD-10-CM

## 2024-10-31 DIAGNOSIS — Z0000 Encounter for general adult medical examination without abnormal findings: Secondary | ICD-10-CM

## 2024-10-31 DIAGNOSIS — R3589 Other polyuria: Secondary | ICD-10-CM

## 2024-10-31 DIAGNOSIS — Z23 Encounter for immunization: Secondary | ICD-10-CM

## 2024-10-31 DIAGNOSIS — I10 Essential (primary) hypertension: Secondary | ICD-10-CM | POA: Diagnosis not present

## 2024-10-31 LAB — BAYER DCA HB A1C WAIVED: HB A1C (BAYER DCA - WAIVED): 4.2 % — ABNORMAL LOW (ref 4.8–5.6)

## 2024-10-31 MED ORDER — ALFUZOSIN HCL ER 10 MG PO TB24: ORAL_TABLET | ORAL | 3 refills | Status: AC

## 2024-10-31 MED ORDER — ESZOPICLONE 3 MG PO TABS: 3.0000 mg | ORAL_TABLET | Freq: Every evening | ORAL | 1 refills | Status: AC | PRN

## 2024-10-31 MED ORDER — TOLTERODINE TARTRATE ER 4 MG PO CP24: 4.0000 mg | ORAL_CAPSULE | Freq: Every day | ORAL | 3 refills | Status: AC

## 2024-10-31 MED ORDER — TRAZODONE HCL 100 MG PO TABS: 200.0000 mg | ORAL_TABLET | Freq: Every day | ORAL | 1 refills | Status: AC

## 2024-10-31 MED ORDER — DICLOFENAC SODIUM 75 MG PO TBEC
DELAYED_RELEASE_TABLET | ORAL | 3 refills | Status: AC
Start: 2024-10-31 — End: ?

## 2024-10-31 MED ORDER — PANTOPRAZOLE SODIUM 40 MG PO TBEC: DELAYED_RELEASE_TABLET | ORAL | 3 refills | Status: AC

## 2024-10-31 MED ORDER — QUETIAPINE FUMARATE 25 MG PO TABS: 25.0000 mg | ORAL_TABLET | Freq: Every day | ORAL | 1 refills | Status: AC

## 2024-10-31 NOTE — Patient Instructions (Addendum)
    VISIT SUMMARY: During your visit, we discussed your concerns about blood pressure fluctuations, sleep disturbances, and frequent urination. We reviewed your current medications and made some adjustments to help manage your symptoms. You were also referred to a urologist for further evaluation of your urinary symptoms.  YOUR PLAN: BLOOD PRESSURE FLUCTUATIONS: You have experienced significant fluctuations in blood pressure, leading to dizziness and fainting. -Continue taking your current medications: amlodipine , alfuzosin , and tolterodine . Increase your water intake to help prevent dizziness and hypotension. -Monitor your blood pressure regularly and stay hydrated.  SLEEP DISTURBANCES: You have difficulty staying asleep, with frequent awakenings and racing thoughts. -Increase Lunesta  to 3 mg and trazodone  to 200 mg at bedtime. -Start taking Xeraxone 25 mg at bedtime to help manage anxiety.  FREQUENT URINATION: You experience frequent urination during the day and night, affecting your sleep quality. -Continue taking alfuzosin  and tolterodine . -You are referred to a urologist for further evaluation and potential procedures.  LEFT HIP PAIN: Intermittent left hip pain, likely due to osteoarthritis, is exacerbated by certain movements and standing barefoot. -Use Tylenol  Arthritis as needed for pain relief. -Consider using supportive footwear to alleviate pain.  FOOT PAIN: Foot pain, likely due to overuse and mechanical factors, is exacerbated by standing barefoot. -Use supportive footwear and compression socks. -Consider using shower shoes to reduce foot pain.  CERUMEN IMPACTION: Wax buildup in your left ear, which is not obstructing the hearing aid or eardrum. -Use Debrox drops daily for 1-2 weeks to manage cerumen impaction.  GENERAL HEALTH MAINTENANCE: Regular dental and eye check-ups are important for your overall health. -Maintain regular follow-ups with specialists for ongoing health  maintenance.                      Contains text generated by [employer]bridge.                                 Contains text generated by [employer]bridge.                        Contains text generated by Abridge.                                 Contains text generated by [employer]bridge.

## 2024-10-31 NOTE — Progress Notes (Signed)
 Subjective:  Patient ID: Danny Snyder, male    DOB: 1966/09/17  Age: 58 y.o. MRN: [id number]  CC: Annual Exam (Bp has been dropping but might be due to dehydration. 90s/50s. If he drinks water por liquid IV he doesn't have this problem. Brought readings with him. )   HPI  Discussed the use of AI scribe software for clinical note transcription with the patient, who gave verbal consent to proceed.  History of Present Illness Danny Snyder is a 58 year old male who presents for CPE. He has concerns about blood pressure fluctuations and sleep disturbances.  Over the past five to six months, he has experienced significant fluctuations in blood pressure, with episodes of hypotension reaching the 90s/50s, leading to dizziness and fainting, including an incident at a concert during the summer. Increased water intake helps prevent these episodes, though it results in frequent urination. He is currently taking amlodipine  at the lowest dose, alfuzosin , and tolterodine  regularly, with rare use of hyoscyamine  and an indigestion pill twice daily. Blood pressure monitoring shows stability at present.  He experiences significant sleep disturbances, characterized by frequent awakenings at night, often three times, to urinate. He has difficulty returning to sleep after the second or third awakening, with a 'busy' mind thinking about work or other matters. He is taking Lunesta  and trazodone  to aid sleep but continues to struggle with staying asleep.  Frequent urination occurs during the day, especially after drinking water, with multiple voids within an hour after lunch. He uses compression socks and orthopedic boots to manage foot pain attributed to working on cement floors. He has a history of periodontal disease and has been focusing on dental care recently. He reports hip pain that bothers him occasionally and foot pain when standing barefoot, which he attributes to working on cement floors. Occasional use  of Tylenol  arthritis for pain relief is reported.  No issues with shortness of breath are present, and he is not a smoker. He has received his flu shot. His brother-in-law had ankylosing spondylitis, which affected his ability to work.          04/24/2024    8:07 AM 10/26/2023    7:51 AM 04/25/2023    9:17 AM  Depression screen PHQ 2/9  Decreased Interest 0 0 0  Down, Depressed, Hopeless 0 0 0  PHQ - 2 Score 0 0 0  Altered sleeping 0    Tired, decreased energy 0    Change in appetite 0    Feeling bad or failure about yourself  0    Trouble concentrating 0    Moving slowly or fidgety/restless 0    Suicidal thoughts 0    PHQ-9 Score 0     Difficult doing work/chores Not difficult at all       Data saved with a previous flowsheet row definition    History Cristo has a past medical history of Allergy, Arthritis, GERD (gastroesophageal reflux disease), and Raynaud's disease.   He has a past surgical history that includes Inguinal hernia repair and Colonoscopy.   His family history includes Arthritis in his brother and mother; Cancer in his father; Diabetes in his father; Stomach cancer in his father.He reports that he has quit smoking. He has quit using smokeless tobacco.  His smokeless tobacco use included chew. He reports current alcohol use. He reports that he does not use drugs.    ROS Review of Systems  Constitutional:  Negative for activity change, fatigue and unexpected weight change.  HENT:  Negative for congestion, ear pain, hearing loss, postnasal drip and trouble swallowing.   Eyes:  Negative for pain and visual disturbance.  Respiratory:  Negative for cough, chest tightness and shortness of breath.   Cardiovascular:  Negative for chest pain, palpitations and leg swelling.  Gastrointestinal:  Negative for abdominal distention, abdominal pain, blood in stool, constipation, diarrhea, nausea and vomiting.  Endocrine: Positive for polydipsia and polyuria. Negative for cold  intolerance and heat intolerance.  Genitourinary:  Positive for frequency. Negative for difficulty urinating, dysuria, flank pain and urgency.  Musculoskeletal:  Negative for arthralgias and joint swelling.  Skin:  Negative for color change, rash and wound.  Neurological:  Negative for dizziness, syncope, speech difficulty, weakness, light-headedness, numbness and headaches.  Hematological:  Does not bruise/bleed easily.  Psychiatric/Behavioral:  Positive for sleep disturbance. Negative for confusion, decreased concentration and dysphoric mood. The patient is not nervous/anxious.     Objective:  BP 111/69   Pulse 74   Temp 97.9 F (36.6 C)   Ht 6' 5 (1.956 m)   Wt 202 lb (91.6 kg)   SpO2 97%   BMI 23.95 kg/m   BP Readings from Last 3 Encounters:  10/31/24 111/69  09/29/24 96/65  07/09/24 108/65    Wt Readings from Last 3 Encounters:  10/31/24 202 lb (91.6 kg)  09/29/24 200 lb (90.7 kg)  07/09/24 204 lb (92.5 kg)     Physical Exam Constitutional:      Appearance: He is well-developed.  HENT:     Head: Normocephalic and atraumatic.  Eyes:     Pupils: Pupils are equal, round, and reactive to light.  Neck:     Thyroid: No thyromegaly.     Trachea: No tracheal deviation.  Cardiovascular:     Rate and Rhythm: Normal rate and regular rhythm.     Heart sounds: Normal heart sounds. No murmur heard.    No friction rub. No gallop.  Pulmonary:     Breath sounds: Normal breath sounds. No wheezing or rales.  Abdominal:     General: Bowel sounds are normal. There is no distension.     Palpations: Abdomen is soft. There is no mass.     Tenderness: There is no abdominal tenderness.     Hernia: There is no hernia in the left inguinal area.  Genitourinary:    Penis: Normal.      Testes: Normal.  Musculoskeletal:        General: Normal range of motion.     Cervical back: Normal range of motion.  Lymphadenopathy:     Cervical: No cervical adenopathy.  Skin:    General: Skin  is warm and dry.  Neurological:     Mental Status: He is alert and oriented to person, place, and time.    Physical Exam GENERAL: Alert, cooperative, well developed, no acute distress. HEENT: Normocephalic, normal oropharynx, moist mucous membranes, cerumen present in left ear canal. NECK: Carotid arteries normal. CHEST: Clear to auscultation bilaterally, no wheezes, rhonchi, or crackles. CARDIOVASCULAR: Normal heart rate and rhythm, S1 and S2 normal without murmurs. ABDOMEN: Soft, non-tender, non-distended, without organomegaly, normal bowel sounds, no masses. GENITOURINARY: Left testicle smaller than right, no inguinal hernia. EXTREMITIES: No cyanosis or edema. MUSCULOSKELETAL: Left hip pain on internal rotation. NEUROLOGICAL: Cranial nerves grossly intact, moves all extremities without gross motor or sensory deficit.   Assessment & Plan:  Physical exam, annual -     Bayer DCA Hb A1c Waived -     CBC  with Differential/Platelet -     Comprehensive metabolic panel with GFR -     PSA, total and free -     VITAMIN D  25 Hydroxy (Vit-D Deficiency, Fractures)  Primary hypertension -     Bayer DCA Hb A1c Waived -     CBC with Differential/Platelet -     Comprehensive metabolic panel with GFR -     PSA, total and free  Vitamin D  deficiency -     VITAMIN D  25 Hydroxy (Vit-D Deficiency, Fractures)  Mixed hyperlipidemia -     Lipid panel  Prostate cancer screening -     PSA, total and free  Encounter for immunization -     Flu vaccine trivalent PF, 6mos and older(Flulaval,Afluria,Fluarix,Fluzone)  Polyuria -     ACTH -     Ambulatory referral to Urology  Primary insomnia  Benign prostatic hyperplasia with urinary frequency -     Ambulatory referral to Urology  Other orders -     Eszopiclone ; Take 1 tablet (3 mg total) by mouth at bedtime as needed for sleep. Take immediately before bedtime  Dispense: 90 tablet; Refill: 1 -     Tolterodine  Tartrate ER; Take 1 capsule (4 mg  total) by mouth daily.  Dispense: 90 capsule; Refill: 3 -     Pantoprazole  Sodium; TAKE ONE TABLET BY MOUTH TWICE DAILY FOR STOMACH  Dispense: 180 tablet; Refill: 3 -     Alfuzosin  HCl ER; TAKE ONE TABLET DAILY WITH BREAKFAST  Dispense: 90 tablet; Refill: 3 -     Diclofenac  Sodium; TAKE ONE TABLET TWICE DAILY AS NEEDED FOR MUSCLE AND JOINT PAIN  Dispense: 180 tablet; Refill: 3 -     traZODone  HCl; Take 2 tablets (200 mg total) by mouth at bedtime. TAKE 1/3 TO ONE TABLET AT BEDTIME AS NEEDED FOR SLEEP  Dispense: 180 tablet; Refill: 1 -     QUEtiapine Fumarate; Take 1 tablet (25 mg total) by mouth at bedtime.  Dispense: 90 tablet; Refill: 1    Assessment and Plan Assessment & Plan Adult Wellness Visit   Routine wellness visit showed normal blood pressure and A1c levels, with no signs of diabetes. Discussed the importance of hydration for managing blood pressure and preventing dizziness. Continue current medications and lifestyle modifications. Encourage regular hydration to prevent dizziness and hypotension.  Benign prostatic hyperplasia with lower urinary tract symptoms and polyuria   He experiences persistent nocturia and polyuria despite treatment with alfuzosin  and tolterodine , affecting sleep quality. Possible bladder spasms or small bladder size are considered. He is referred to a urologist for further evaluation and potential procedures. Continue alfuzosin  and tolterodine  until the urology consultation.  Primary insomnia with nocturnal awakenings   He has difficulty staying asleep, with frequent nocturnal awakenings and racing thoughts. Anxiety may contribute to these symptoms. Current treatment includes Lunesta  and trazodone . Increased Lunesta  to 3 mg and trazodone  to 200 mg at bedtime. Prescribed Xeraxone 25 mg at bedtime for anxiety management.  Essential hypertension   Blood pressure is well-controlled with the current medication regimen. Episodes of hypotension may relate to hydration  status and medication side effects. Continue the current antihypertensive regimen and monitor blood pressure regularly. Encourage adequate hydration to prevent hypotensive episodes.  Left hip pain, likely osteoarthritis   Intermittent left hip pain, likely due to osteoarthritis, is exacerbated by certain movements and standing barefoot. Managed with Tylenol  Arthritis as needed. Consider using supportive footwear to alleviate pain.  Foot pain, likely due to overuse/mechanical factors  Foot pain, likely due to overuse and mechanical factors, is exacerbated by standing barefoot. Managed with supportive footwear and compression socks. Consider using shower shoes to reduce foot pain.  Cerumen impaction, left ear   Cerumen impaction in the left ear is not obstructing the hearing aid or eardrum. There is potential for wax buildup with hearing aid use. Use Debrox drops daily for 1-2 weeks to manage cerumen impaction.  General Health Maintenance   Discussed the importance of regular dental and eye check-ups. Encourage maintaining regular follow-ups with specialists for ongoing health maintenance.       Follow-up: Return in about 1 month (around 11/30/2024).  Butler Der, M.D.

## 2024-11-01 LAB — COMPREHENSIVE METABOLIC PANEL WITH GFR
ALT: 24 IU/L (ref 0–44)
AST: 23 IU/L (ref 0–40)
Albumin: 4.4 g/dL (ref 3.8–4.9)
Alkaline Phosphatase: 59 IU/L (ref 47–123)
BUN/Creatinine Ratio: 16 (ref 9–20)
BUN: 16 mg/dL (ref 6–24)
Bilirubin Total: 0.3 mg/dL (ref 0.0–1.2)
CO2: 25 mmol/L (ref 20–29)
Calcium: 9.3 mg/dL (ref 8.7–10.2)
Chloride: 106 mmol/L (ref 96–106)
Creatinine, Ser: 0.97 mg/dL (ref 0.76–1.27)
Globulin, Total: 1.8 g/dL (ref 1.5–4.5)
Glucose: 104 mg/dL — ABNORMAL HIGH (ref 70–99)
Potassium: 4.4 mmol/L (ref 3.5–5.2)
Sodium: 141 mmol/L (ref 134–144)
Total Protein: 6.2 g/dL (ref 6.0–8.5)
eGFR: 91 mL/min/1.73 (ref >59)

## 2024-11-01 LAB — VITAMIN D 25 HYDROXY (VIT D DEFICIENCY, FRACTURES): Vit D, 25-Hydroxy: 52.4 ng/mL (ref 30.0–100.0)

## 2024-11-01 LAB — CBC WITH DIFFERENTIAL/PLATELET
Basophils Absolute: 0 x10E3/uL (ref 0.0–0.2)
Basos: 0 %
EOS (ABSOLUTE): 0.1 x10E3/uL (ref 0.0–0.4)
Eos: 3 %
Hematocrit: 42.1 % (ref 37.5–51.0)
Hemoglobin: 13.8 g/dL (ref 13.0–17.7)
Immature Grans (Abs): 0 x10E3/uL (ref 0.0–0.1)
Immature Granulocytes: 0 %
Lymphocytes Absolute: 1 x10E3/uL (ref 0.7–3.1)
Lymphs: 25 %
MCH: 29.2 pg (ref 26.6–33.0)
MCHC: 32.8 g/dL (ref 31.5–35.7)
MCV: 89 fL (ref 79–97)
Monocytes Absolute: 0.4 x10E3/uL (ref 0.1–0.9)
Monocytes: 9 %
Neutrophils Absolute: 2.6 x10E3/uL (ref 1.4–7.0)
Neutrophils: 63 %
Platelets: 173 x10E3/uL (ref 150–450)
RBC: 4.72 x10E6/uL (ref 4.14–5.80)
RDW: 13.5 % (ref 11.6–15.4)
WBC: 4.1 x10E3/uL (ref 3.4–10.8)

## 2024-11-01 LAB — PSA, TOTAL AND FREE
PSA, Free Pct: 48.8 %
PSA, Free: 0.39 ng/mL
Prostate Specific Ag, Serum: 0.8 ng/mL (ref 0.0–4.0)

## 2024-11-01 LAB — LIPID PANEL
Chol/HDL Ratio: 3.7 ratio (ref 0.0–5.0)
Cholesterol, Total: 189 mg/dL (ref 100–199)
HDL: 51 mg/dL (ref >39)
LDL Chol Calc (NIH): 118 mg/dL — ABNORMAL HIGH (ref 0–99)
Triglycerides: 112 mg/dL (ref 0–149)
VLDL Cholesterol Cal: 20 mg/dL (ref 5–40)

## 2024-11-03 ENCOUNTER — Ambulatory Visit: Payer: Self-pay | Admitting: Family Medicine

## 2024-11-03 NOTE — Progress Notes (Signed)
Hello Danny Snyder,  Your lab result is normal and/or stable.Some minor variations that are not significant are commonly marked abnormal, but do not represent any medical problem for you.  Best regards, Savanna Dooley, M.D.

## 2024-12-04 ENCOUNTER — Ambulatory Visit: Admitting: Family Medicine

## 2024-12-04 ENCOUNTER — Encounter: Payer: Self-pay | Admitting: Family Medicine

## 2024-12-04 VITALS — BP 115/67 | HR 65 | Temp 97.7°F | Ht 77.0 in | Wt 209.0 lb

## 2024-12-04 DIAGNOSIS — R55 Syncope and collapse: Secondary | ICD-10-CM

## 2024-12-04 DIAGNOSIS — K2100 Gastro-esophageal reflux disease with esophagitis, without bleeding: Secondary | ICD-10-CM | POA: Diagnosis not present

## 2024-12-04 DIAGNOSIS — I10 Essential (primary) hypertension: Secondary | ICD-10-CM | POA: Diagnosis not present

## 2024-12-04 DIAGNOSIS — F5101 Primary insomnia: Secondary | ICD-10-CM | POA: Diagnosis not present

## 2024-12-04 DIAGNOSIS — M150 Primary generalized (osteo)arthritis: Secondary | ICD-10-CM | POA: Diagnosis not present

## 2024-12-04 NOTE — Progress Notes (Unsigned)
 Subjective:  Patient ID: Danny Snyder, male    DOB: 1966-10-04  Age: 58 y.o. MRN: [id number]  CC: Medical Management of Chronic Issues (Pt reporst that he is sleeping better. )   HPI  Discussed the use of AI scribe software for clinical note transcription with the patient, who gave verbal consent to proceed.  History of Present Illness Danny Snyder is a 58 year old male who presents for follow-up on his sleep medication.  He has experienced significant improvement in his sleep since starting on 3 mg of Lunesta  and trazodone  for anxiety. Additionally, 25 mg of quetiapine  was added at bedtime to aid with sleep. He is now sleeping well without issues, does not wake up excessively during the night, and does not experience a 'hungover' feeling upon waking.  He previously experienced significant fluctuations in blood pressure, with readings dropping into the 90s/50s range. There was concern that this might be related to his prostate medications, specifically alfuzosin . However, he reports improvement in his blood pressure stability, attributing this to better hydration.  He has a history of benign prostatic hyperplasia (BPH) which has caused bladder spasms and nocturia.  He also reports improvement in left hip pain since the last visit. He is taking diclofenac  for joint pain and Protonix  to manage reflux and prevent NSAID-induced ulcers.          04/24/2024    8:07 AM 10/26/2023    7:51 AM 04/25/2023    9:17 AM  Depression screen PHQ 2/9  Decreased Interest 0 0 0  Down, Depressed, Hopeless 0 0 0  PHQ - 2 Score 0 0 0  Altered sleeping 0    Tired, decreased energy 0    Change in appetite 0    Feeling bad or failure about yourself  0    Trouble concentrating 0    Moving slowly or fidgety/restless 0    Suicidal thoughts 0    PHQ-9 Score 0     Difficult doing work/chores Not difficult at all       Data saved with a previous flowsheet row definition    History Spike has a past  medical history of Allergy, Arthritis, GERD (gastroesophageal reflux disease), and Raynaud's disease.   He has a past surgical history that includes Inguinal hernia repair and Colonoscopy.   His family history includes Arthritis in his brother and mother; Cancer in his father; Diabetes in his father; Stomach cancer in his father.He reports that he has quit smoking. He has quit using smokeless tobacco.  His smokeless tobacco use included chew. He reports current alcohol use. He reports that he does not use drugs.    ROS Review of Systems  Constitutional: Negative.   HENT: Negative.    Eyes:  Negative for visual disturbance.  Respiratory:  Negative for cough and shortness of breath.   Cardiovascular:  Negative for chest pain and leg swelling.  Gastrointestinal:  Negative for abdominal pain, diarrhea, nausea and vomiting.  Genitourinary:  Negative for difficulty urinating.  Musculoskeletal:  Negative for arthralgias and myalgias.  Skin:  Negative for rash.  Neurological:  Negative for headaches.  Psychiatric/Behavioral:  Negative for sleep disturbance.     Objective:  BP 115/67   Pulse 65   Temp 97.7 F (36.5 C)   Ht 6' 5 (1.956 m)   Wt 209 lb (94.8 kg)   SpO2 98%   BMI 24.78 kg/m   BP Readings from Last 3 Encounters:  12/04/24 115/67  10/31/24 111/69  09/29/24 96/65    Wt Readings from Last 3 Encounters:  12/04/24 209 lb (94.8 kg)  10/31/24 202 lb (91.6 kg)  09/29/24 200 lb (90.7 kg)     Physical Exam Physical Exam GENERAL: Alert, cooperative, well developed, no acute distress HEENT: Normocephalic, normal oropharynx, moist mucous membranes CHEST: Clear to auscultation bilaterally, No wheezes, rhonchi, or crackles CARDIOVASCULAR: Normal heart rate and rhythm, S1 and S2 normal without murmurs ABDOMEN: Soft, non-tender, non-distended, without organomegaly, Normal bowel sounds EXTREMITIES: No cyanosis or edema NEUROLOGICAL: Cranial nerves grossly intact, Moves all  extremities without gross motor or sensory deficit   Assessment & Plan:  Primary insomnia  Gastroesophageal reflux disease with esophagitis without hemorrhage  Primary osteoarthritis involving multiple joints  Primary hypertension  Syncope and collapse    Assessment and Plan Assessment & Plan Chronic insomnia   His insomnia is well-managed with the current medication regimen. He reports sleeping well without nocturnal awakenings or morning hangover effects. Continue Lunesta  3 mg at bedtime, trazodone  for anxiety symptoms, and prazepam 25 mg at bedtime.  Anxiety disorder   Anxiety is managed with trazodone , which also aids in sleep. Continue trazodone  for anxiety symptoms.  Benign prostatic hyperplasia with lower urinary tract symptoms   He experiences BPH with bladder spasms and nocturia. A referral to urology is pending. Continue to follow up on the urology referral.  Orthostatic hypotension   He has had previous episodes of blood pressure dropping into the 90s/50s, possibly related to prostate medications. Improvement is noted with better hydration. Continue to monitor blood pressure and encourage adequate hydration.  Left hip pain   His left hip pain has improved since the last visit and is managed with diclofenac  for joint pain. Continue diclofenac  for joint pain.  Gastroesophageal reflux disease   GERD is managed with Protonix , which also prevents NSAID-induced ulcers. Continue Protonix  for GERD and ulcer prevention.       Follow-up: Return in about 6 months (around 06/04/2025) for cholesterol, hypertension.  Butler Der, M.D.

## 2024-12-05 ENCOUNTER — Encounter: Payer: Self-pay | Admitting: Family Medicine

## 2024-12-16 ENCOUNTER — Other Ambulatory Visit: Payer: Self-pay | Admitting: Family Medicine

## 2025-01-24 ENCOUNTER — Encounter: Payer: Self-pay | Admitting: *Deleted

## 2025-06-04 ENCOUNTER — Ambulatory Visit: Admitting: Family Medicine

## 2025-06-09 ENCOUNTER — Ambulatory Visit: Admitting: Family Medicine
